# Patient Record
Sex: Male | Born: 1945 | Race: White | Hispanic: No | Marital: Married | Smoking: Former smoker
Health system: Southern US, Community
[De-identification: ages and names within clinical notes are randomized; demographics above are authoritative.]

## PROBLEM LIST (undated history)

## (undated) DIAGNOSIS — E785 Hyperlipidemia, unspecified: Secondary | ICD-10-CM

## (undated) DIAGNOSIS — I219 Acute myocardial infarction, unspecified: Secondary | ICD-10-CM

## (undated) DIAGNOSIS — N529 Male erectile dysfunction, unspecified: Secondary | ICD-10-CM

## (undated) DIAGNOSIS — G4730 Sleep apnea, unspecified: Secondary | ICD-10-CM

## (undated) DIAGNOSIS — M47817 Spondylosis without myelopathy or radiculopathy, lumbosacral region: Secondary | ICD-10-CM

## (undated) DIAGNOSIS — J439 Emphysema, unspecified: Secondary | ICD-10-CM

## (undated) DIAGNOSIS — F329 Major depressive disorder, single episode, unspecified: Secondary | ICD-10-CM

## (undated) DIAGNOSIS — F32A Depression, unspecified: Secondary | ICD-10-CM

## (undated) DIAGNOSIS — I2510 Atherosclerotic heart disease of native coronary artery without angina pectoris: Secondary | ICD-10-CM

## (undated) DIAGNOSIS — E538 Deficiency of other specified B group vitamins: Secondary | ICD-10-CM

## (undated) DIAGNOSIS — I10 Essential (primary) hypertension: Secondary | ICD-10-CM

## (undated) HISTORY — DX: Emphysema, unspecified: J43.9

## (undated) HISTORY — PX: OTHER SURGICAL HISTORY: SHX169

## (undated) HISTORY — DX: Hyperlipidemia, unspecified: E78.5

## (undated) HISTORY — DX: Acute myocardial infarction, unspecified: I21.9

## (undated) HISTORY — PX: CERVICAL LAMINECTOMY: SHX94

## (undated) HISTORY — PX: TONSILLECTOMY: SUR1361

## (undated) HISTORY — DX: Male erectile dysfunction, unspecified: N52.9

## (undated) HISTORY — DX: Spondylosis without myelopathy or radiculopathy, lumbosacral region: M47.817

## (undated) HISTORY — DX: Depression, unspecified: F32.A

## (undated) HISTORY — DX: Major depressive disorder, single episode, unspecified: F32.9

## (undated) HISTORY — PX: KNEE SURGERY: SHX244

## (undated) HISTORY — DX: Deficiency of other specified B group vitamins: E53.8

## (undated) HISTORY — DX: Essential (primary) hypertension: I10

## (undated) HISTORY — DX: Atherosclerotic heart disease of native coronary artery without angina pectoris: I25.10

## (undated) HISTORY — DX: Sleep apnea, unspecified: G47.30

## (undated) HISTORY — PX: VASECTOMY REVERSAL: SHX243

---

## 1997-05-25 ENCOUNTER — Ambulatory Visit (HOSPITAL_COMMUNITY): Admission: RE | Admit: 1997-05-25 | Discharge: 1997-05-25 | Payer: Self-pay | Admitting: *Deleted

## 1997-05-27 ENCOUNTER — Ambulatory Visit (HOSPITAL_COMMUNITY): Admission: RE | Admit: 1997-05-27 | Discharge: 1997-05-28 | Payer: Self-pay | Admitting: *Deleted

## 1998-06-29 ENCOUNTER — Ambulatory Visit: Admission: RE | Admit: 1998-06-29 | Discharge: 1998-06-29 | Payer: Self-pay | Admitting: *Deleted

## 1999-01-02 ENCOUNTER — Encounter: Admission: RE | Admit: 1999-01-02 | Discharge: 1999-01-02 | Payer: Self-pay | Admitting: Family Medicine

## 1999-01-12 ENCOUNTER — Encounter: Payer: Self-pay | Admitting: Sports Medicine

## 1999-01-12 ENCOUNTER — Encounter: Admission: RE | Admit: 1999-01-12 | Discharge: 1999-01-12 | Payer: Self-pay | Admitting: Sports Medicine

## 1999-01-15 HISTORY — PX: ANGIOPLASTY: SHX39

## 1999-01-30 ENCOUNTER — Encounter: Admission: RE | Admit: 1999-01-30 | Discharge: 1999-01-30 | Payer: Self-pay | Admitting: Family Medicine

## 1999-04-05 ENCOUNTER — Ambulatory Visit (HOSPITAL_COMMUNITY): Admission: RE | Admit: 1999-04-05 | Discharge: 1999-04-05 | Payer: Self-pay | Admitting: Family Medicine

## 1999-04-05 ENCOUNTER — Encounter: Admission: RE | Admit: 1999-04-05 | Discharge: 1999-04-05 | Payer: Self-pay | Admitting: Family Medicine

## 1999-10-07 ENCOUNTER — Inpatient Hospital Stay (HOSPITAL_COMMUNITY): Admission: EM | Admit: 1999-10-07 | Discharge: 1999-10-09 | Payer: Self-pay | Admitting: *Deleted

## 1999-10-07 ENCOUNTER — Encounter: Payer: Self-pay | Admitting: Emergency Medicine

## 1999-10-23 ENCOUNTER — Encounter (HOSPITAL_COMMUNITY): Admission: RE | Admit: 1999-10-23 | Discharge: 2000-01-21 | Payer: Self-pay | Admitting: Cardiology

## 2003-12-12 ENCOUNTER — Ambulatory Visit: Payer: Self-pay | Admitting: Family Medicine

## 2004-11-23 ENCOUNTER — Ambulatory Visit: Payer: Self-pay | Admitting: Family Medicine

## 2004-11-30 ENCOUNTER — Ambulatory Visit: Payer: Self-pay | Admitting: Family Medicine

## 2005-01-29 ENCOUNTER — Ambulatory Visit: Payer: Self-pay | Admitting: Gastroenterology

## 2005-04-29 ENCOUNTER — Ambulatory Visit: Payer: Self-pay | Admitting: Gastroenterology

## 2005-04-29 ENCOUNTER — Encounter (INDEPENDENT_AMBULATORY_CARE_PROVIDER_SITE_OTHER): Payer: Self-pay | Admitting: Specialist

## 2005-05-02 ENCOUNTER — Ambulatory Visit: Payer: Self-pay | Admitting: Cardiology

## 2005-06-28 ENCOUNTER — Ambulatory Visit: Payer: Self-pay

## 2005-06-28 ENCOUNTER — Ambulatory Visit: Payer: Self-pay | Admitting: Cardiology

## 2006-04-01 ENCOUNTER — Ambulatory Visit: Payer: Self-pay | Admitting: Family Medicine

## 2006-04-01 LAB — CONVERTED CEMR LAB
ALT: 27 units/L (ref 0–40)
AST: 22 units/L (ref 0–37)
Albumin: 4 g/dL (ref 3.5–5.2)
Alkaline Phosphatase: 55 units/L (ref 39–117)
BUN: 7 mg/dL (ref 6–23)
Basophils Absolute: 0 10*3/uL (ref 0.0–0.1)
Basophils Relative: 0.1 % (ref 0.0–1.0)
Bilirubin, Direct: 0.1 mg/dL (ref 0.0–0.3)
CO2: 27 meq/L (ref 19–32)
Calcium: 8.7 mg/dL (ref 8.4–10.5)
Chloride: 107 meq/L (ref 96–112)
Cholesterol: 151 mg/dL (ref 0–200)
Creatinine, Ser: 1 mg/dL (ref 0.4–1.5)
Eosinophils Absolute: 0.3 10*3/uL (ref 0.0–0.6)
Eosinophils Relative: 3.7 % (ref 0.0–5.0)
GFR calc Af Amer: 98 mL/min
GFR calc non Af Amer: 81 mL/min
Glucose, Bld: 122 mg/dL — ABNORMAL HIGH (ref 70–99)
HCT: 42 % (ref 39.0–52.0)
HDL: 36.1 mg/dL — ABNORMAL LOW (ref >39.0)
Hemoglobin: 14.7 g/dL (ref 13.0–17.0)
Hgb A1c MFr Bld: 5.7 % (ref 4.6–6.0)
LDL Cholesterol: 75 mg/dL (ref 0–99)
Lymphocytes Relative: 19.1 % (ref 12.0–46.0)
MCHC: 35 g/dL (ref 30.0–36.0)
MCV: 96.8 fL (ref 78.0–100.0)
Monocytes Absolute: 0.5 10*3/uL (ref 0.2–0.7)
Monocytes Relative: 7.2 % (ref 3.0–11.0)
Neutro Abs: 5.1 10*3/uL (ref 1.4–7.7)
Neutrophils Relative %: 69.9 % (ref 43.0–77.0)
PSA: 0.93 ng/mL (ref 0.10–4.00)
Platelets: 152 10*3/uL (ref 150–400)
Potassium: 4.5 meq/L (ref 3.5–5.1)
RBC: 4.34 M/uL (ref 4.22–5.81)
RDW: 12.2 % (ref 11.5–14.6)
Sodium: 139 meq/L (ref 135–145)
TSH: 1.53 microintl units/mL (ref 0.35–5.50)
Total Bilirubin: 0.8 mg/dL (ref 0.3–1.2)
Total CHOL/HDL Ratio: 4.2
Total Protein: 6.2 g/dL (ref 6.0–8.3)
Triglycerides: 198 mg/dL — ABNORMAL HIGH (ref 0–149)
VLDL: 40 mg/dL (ref 0–40)
WBC: 7.3 10*3/uL (ref 4.5–10.5)

## 2006-04-15 ENCOUNTER — Ambulatory Visit: Payer: Self-pay | Admitting: Family Medicine

## 2006-04-17 ENCOUNTER — Ambulatory Visit: Payer: Self-pay | Admitting: Cardiology

## 2007-02-16 ENCOUNTER — Telehealth: Payer: Self-pay | Admitting: Family Medicine

## 2007-05-29 ENCOUNTER — Ambulatory Visit: Payer: Self-pay | Admitting: Cardiology

## 2007-06-02 ENCOUNTER — Ambulatory Visit: Payer: Self-pay | Admitting: Family Medicine

## 2007-06-02 LAB — CONVERTED CEMR LAB
Bilirubin Urine: NEGATIVE
Blood in Urine, dipstick: NEGATIVE
Glucose, Urine, Semiquant: NEGATIVE
Ketones, urine, test strip: NEGATIVE
Nitrite: NEGATIVE
Specific Gravity, Urine: 1.025
Urobilinogen, UA: 0.2
WBC Urine, dipstick: NEGATIVE
pH: 6.5

## 2007-06-05 LAB — CONVERTED CEMR LAB
ALT: 25 units/L (ref 0–53)
AST: 23 units/L (ref 0–37)
Albumin: 4.4 g/dL (ref 3.5–5.2)
Alkaline Phosphatase: 63 units/L (ref 39–117)
BUN: 11 mg/dL (ref 6–23)
Basophils Absolute: 0 10*3/uL (ref 0.0–0.1)
Basophils Relative: 0.1 % (ref 0.0–1.0)
Bilirubin, Direct: 0.1 mg/dL (ref 0.0–0.3)
CO2: 29 meq/L (ref 19–32)
Calcium: 9.6 mg/dL (ref 8.4–10.5)
Chloride: 107 meq/L (ref 96–112)
Cholesterol: 141 mg/dL (ref 0–200)
Creatinine, Ser: 1 mg/dL (ref 0.4–1.5)
Direct LDL: 70.3 mg/dL
Eosinophils Absolute: 0.2 10*3/uL (ref 0.0–0.7)
Eosinophils Relative: 2.3 % (ref 0.0–5.0)
GFR calc Af Amer: 98 mL/min
GFR calc non Af Amer: 81 mL/min
Glucose, Bld: 112 mg/dL — ABNORMAL HIGH (ref 70–99)
HCT: 46 % (ref 39.0–52.0)
HDL: 28.9 mg/dL — ABNORMAL LOW (ref >39.0)
Hemoglobin: 15.9 g/dL (ref 13.0–17.0)
Hgb A1c MFr Bld: 5.7 % (ref 4.6–6.0)
Lymphocytes Relative: 17.3 % (ref 12.0–46.0)
MCHC: 34.5 g/dL (ref 30.0–36.0)
MCV: 98.1 fL (ref 78.0–100.0)
Monocytes Absolute: 0.7 10*3/uL (ref 0.1–1.0)
Monocytes Relative: 8.1 % (ref 3.0–12.0)
Neutro Abs: 5.8 10*3/uL (ref 1.4–7.7)
Neutrophils Relative %: 72.2 % (ref 43.0–77.0)
PSA: 1.02 ng/mL (ref 0.10–4.00)
Platelets: 161 10*3/uL (ref 150–400)
Potassium: 4.5 meq/L (ref 3.5–5.1)
RBC: 4.69 M/uL (ref 4.22–5.81)
RDW: 12.5 % (ref 11.5–14.6)
Sodium: 141 meq/L (ref 135–145)
TSH: 1.5 microintl units/mL (ref 0.35–5.50)
Total Bilirubin: 1.1 mg/dL (ref 0.3–1.2)
Total CHOL/HDL Ratio: 4.9
Total Protein: 7.5 g/dL (ref 6.0–8.3)
Triglycerides: 238 mg/dL (ref 0–149)
VLDL: 48 mg/dL — ABNORMAL HIGH (ref 0–40)
WBC: 8.1 10*3/uL (ref 4.5–10.5)

## 2007-06-09 ENCOUNTER — Ambulatory Visit: Payer: Self-pay | Admitting: Family Medicine

## 2007-06-09 DIAGNOSIS — F329 Major depressive disorder, single episode, unspecified: Secondary | ICD-10-CM | POA: Insufficient documentation

## 2007-06-09 DIAGNOSIS — I252 Old myocardial infarction: Secondary | ICD-10-CM | POA: Insufficient documentation

## 2007-06-09 DIAGNOSIS — E785 Hyperlipidemia, unspecified: Secondary | ICD-10-CM | POA: Insufficient documentation

## 2007-06-09 DIAGNOSIS — I10 Essential (primary) hypertension: Secondary | ICD-10-CM | POA: Insufficient documentation

## 2007-07-16 ENCOUNTER — Ambulatory Visit: Payer: Self-pay

## 2007-07-27 ENCOUNTER — Encounter: Payer: Self-pay | Admitting: Family Medicine

## 2007-12-02 ENCOUNTER — Ambulatory Visit: Payer: Self-pay | Admitting: Family Medicine

## 2007-12-04 ENCOUNTER — Ambulatory Visit: Payer: Self-pay | Admitting: Family Medicine

## 2007-12-04 DIAGNOSIS — E538 Deficiency of other specified B group vitamins: Secondary | ICD-10-CM | POA: Insufficient documentation

## 2007-12-04 LAB — CONVERTED CEMR LAB
ALT: 21 units/L (ref 0–53)
AST: 22 units/L (ref 0–37)
Albumin: 4.2 g/dL (ref 3.5–5.2)
Alkaline Phosphatase: 52 units/L (ref 39–117)
BUN: 10 mg/dL (ref 6–23)
Basophils Absolute: 0.1 10*3/uL (ref 0.0–0.1)
Basophils Relative: 0.7 % (ref 0.0–3.0)
Bilirubin, Direct: 0.1 mg/dL (ref 0.0–0.3)
CO2: 27 meq/L (ref 19–32)
Calcium: 9.6 mg/dL (ref 8.4–10.5)
Chloride: 106 meq/L (ref 96–112)
Creatinine, Ser: 0.9 mg/dL (ref 0.4–1.5)
Eosinophils Absolute: 0.4 10*3/uL (ref 0.0–0.7)
Eosinophils Relative: 4.5 % (ref 0.0–5.0)
GFR calc Af Amer: 111 mL/min
GFR calc non Af Amer: 91 mL/min
Glucose, Bld: 74 mg/dL (ref 70–99)
HCT: 43.9 % (ref 39.0–52.0)
Hemoglobin: 15.5 g/dL (ref 13.0–17.0)
Lymphocytes Relative: 25 % (ref 12.0–46.0)
MCHC: 35.3 g/dL (ref 30.0–36.0)
MCV: 100.7 fL — ABNORMAL HIGH (ref 78.0–100.0)
Monocytes Absolute: 0.6 10*3/uL (ref 0.1–1.0)
Monocytes Relative: 7.4 % (ref 3.0–12.0)
Neutro Abs: 4.7 10*3/uL (ref 1.4–7.7)
Neutrophils Relative %: 62.4 % (ref 43.0–77.0)
Platelets: 152 10*3/uL (ref 150–400)
Potassium: 4.7 meq/L (ref 3.5–5.1)
RBC: 4.36 M/uL (ref 4.22–5.81)
RDW: 12.9 % (ref 11.5–14.6)
Sodium: 142 meq/L (ref 135–145)
TSH: 1.64 microintl units/mL (ref 0.35–5.50)
Total Bilirubin: 0.7 mg/dL (ref 0.3–1.2)
Total Protein: 7 g/dL (ref 6.0–8.3)
Vitamin B-12: 218 pg/mL (ref 211–911)
WBC: 7.8 10*3/uL (ref 4.5–10.5)

## 2007-12-11 ENCOUNTER — Ambulatory Visit: Payer: Self-pay | Admitting: Family Medicine

## 2007-12-18 ENCOUNTER — Ambulatory Visit: Payer: Self-pay | Admitting: Family Medicine

## 2007-12-25 ENCOUNTER — Ambulatory Visit: Payer: Self-pay | Admitting: Family Medicine

## 2008-01-05 ENCOUNTER — Ambulatory Visit: Payer: Self-pay | Admitting: Family Medicine

## 2008-01-13 ENCOUNTER — Ambulatory Visit: Payer: Self-pay | Admitting: Family Medicine

## 2008-01-21 ENCOUNTER — Ambulatory Visit: Payer: Self-pay | Admitting: Family Medicine

## 2008-01-29 ENCOUNTER — Ambulatory Visit: Payer: Self-pay | Admitting: Family Medicine

## 2008-02-05 ENCOUNTER — Ambulatory Visit: Payer: Self-pay | Admitting: Family Medicine

## 2008-02-12 ENCOUNTER — Ambulatory Visit: Payer: Self-pay | Admitting: Family Medicine

## 2008-02-18 ENCOUNTER — Ambulatory Visit: Payer: Self-pay | Admitting: Family Medicine

## 2008-02-29 ENCOUNTER — Encounter (INDEPENDENT_AMBULATORY_CARE_PROVIDER_SITE_OTHER): Payer: Self-pay | Admitting: *Deleted

## 2008-03-18 ENCOUNTER — Ambulatory Visit: Payer: Self-pay | Admitting: Family Medicine

## 2008-03-30 ENCOUNTER — Telehealth: Payer: Self-pay | Admitting: Family Medicine

## 2008-04-04 ENCOUNTER — Ambulatory Visit: Payer: Self-pay | Admitting: Gastroenterology

## 2008-04-26 ENCOUNTER — Telehealth: Payer: Self-pay | Admitting: Family Medicine

## 2008-04-26 ENCOUNTER — Ambulatory Visit: Payer: Self-pay | Admitting: Family Medicine

## 2008-05-05 ENCOUNTER — Telehealth: Payer: Self-pay | Admitting: Gastroenterology

## 2008-05-09 ENCOUNTER — Telehealth: Payer: Self-pay | Admitting: Gastroenterology

## 2008-05-09 ENCOUNTER — Telehealth: Payer: Self-pay | Admitting: Family Medicine

## 2008-05-31 ENCOUNTER — Ambulatory Visit: Payer: Self-pay | Admitting: Family Medicine

## 2008-06-22 ENCOUNTER — Telehealth: Payer: Self-pay | Admitting: Gastroenterology

## 2008-06-22 ENCOUNTER — Ambulatory Visit: Payer: Self-pay | Admitting: Gastroenterology

## 2008-06-28 ENCOUNTER — Ambulatory Visit: Payer: Self-pay | Admitting: Family Medicine

## 2008-07-04 DIAGNOSIS — I2510 Atherosclerotic heart disease of native coronary artery without angina pectoris: Secondary | ICD-10-CM | POA: Insufficient documentation

## 2008-07-04 DIAGNOSIS — R079 Chest pain, unspecified: Secondary | ICD-10-CM | POA: Insufficient documentation

## 2008-07-04 DIAGNOSIS — Z9861 Coronary angioplasty status: Secondary | ICD-10-CM

## 2008-07-05 ENCOUNTER — Ambulatory Visit: Payer: Self-pay | Admitting: Cardiology

## 2008-07-05 ENCOUNTER — Telehealth: Payer: Self-pay | Admitting: Gastroenterology

## 2008-07-06 ENCOUNTER — Encounter: Payer: Self-pay | Admitting: Gastroenterology

## 2008-07-06 ENCOUNTER — Ambulatory Visit: Payer: Self-pay | Admitting: Gastroenterology

## 2008-07-08 ENCOUNTER — Telehealth: Payer: Self-pay | Admitting: Cardiology

## 2008-07-12 ENCOUNTER — Encounter: Payer: Self-pay | Admitting: Gastroenterology

## 2008-07-27 ENCOUNTER — Ambulatory Visit: Payer: Self-pay | Admitting: Family Medicine

## 2008-08-24 ENCOUNTER — Ambulatory Visit: Payer: Self-pay | Admitting: Family Medicine

## 2008-09-22 ENCOUNTER — Ambulatory Visit: Payer: Self-pay | Admitting: Family Medicine

## 2008-10-14 ENCOUNTER — Ambulatory Visit: Payer: Self-pay | Admitting: Family Medicine

## 2008-10-25 ENCOUNTER — Ambulatory Visit: Payer: Self-pay | Admitting: Family Medicine

## 2008-10-31 ENCOUNTER — Telehealth: Payer: Self-pay | Admitting: Family Medicine

## 2008-11-03 ENCOUNTER — Telehealth: Payer: Self-pay | Admitting: Pulmonary Disease

## 2008-11-08 ENCOUNTER — Telehealth (INDEPENDENT_AMBULATORY_CARE_PROVIDER_SITE_OTHER): Payer: Self-pay | Admitting: *Deleted

## 2008-11-11 ENCOUNTER — Telehealth: Payer: Self-pay | Admitting: Pulmonary Disease

## 2008-11-23 ENCOUNTER — Ambulatory Visit: Payer: Self-pay | Admitting: Pulmonary Disease

## 2008-11-23 DIAGNOSIS — G4733 Obstructive sleep apnea (adult) (pediatric): Secondary | ICD-10-CM | POA: Insufficient documentation

## 2008-11-24 ENCOUNTER — Telehealth: Payer: Self-pay | Admitting: Family Medicine

## 2008-11-28 ENCOUNTER — Telehealth: Payer: Self-pay | Admitting: Family Medicine

## 2008-11-29 ENCOUNTER — Ambulatory Visit: Payer: Self-pay | Admitting: Family Medicine

## 2008-11-29 LAB — CONVERTED CEMR LAB
Bilirubin Urine: NEGATIVE
Glucose, Urine, Semiquant: NEGATIVE
Ketones, urine, test strip: NEGATIVE
Nitrite: NEGATIVE
Protein, U semiquant: NEGATIVE
Specific Gravity, Urine: 1.02
Urobilinogen, UA: 0.2
WBC Urine, dipstick: NEGATIVE
pH: 7

## 2008-11-30 LAB — CONVERTED CEMR LAB
AST: 21 units/L (ref 0–37)
Alkaline Phosphatase: 61 units/L (ref 39–117)
BUN: 12 mg/dL (ref 6–23)
Basophils Absolute: 0.1 10*3/uL (ref 0.0–0.1)
Bilirubin, Direct: 0.1 mg/dL (ref 0.0–0.3)
Calcium: 9.1 mg/dL (ref 8.4–10.5)
Cholesterol: 147 mg/dL (ref 0–200)
Creatinine, Ser: 1 mg/dL (ref 0.4–1.5)
Direct LDL: 80.7 mg/dL
Eosinophils Absolute: 0.2 10*3/uL (ref 0.0–0.7)
GFR calc non Af Amer: 80.49 mL/min (ref >60)
Glucose, Bld: 133 mg/dL — ABNORMAL HIGH (ref 70–99)
HDL: 35.2 mg/dL — ABNORMAL LOW (ref >39.00)
Hemoglobin: 14 g/dL (ref 13.0–17.0)
Lymphocytes Relative: 19.1 % (ref 12.0–46.0)
MCHC: 34.1 g/dL (ref 30.0–36.0)
Monocytes Relative: 7.7 % (ref 3.0–12.0)
Neutrophils Relative %: 68.7 % (ref 43.0–77.0)
Platelets: 141 10*3/uL — ABNORMAL LOW (ref 150.0–400.0)
RDW: 12.2 % (ref 11.5–14.6)
Sodium: 141 meq/L (ref 135–145)
Total Bilirubin: 0.9 mg/dL (ref 0.3–1.2)
Triglycerides: 248 mg/dL — ABNORMAL HIGH (ref 0.0–149.0)
Vitamin B-12: 464 pg/mL (ref 211–911)

## 2008-12-05 ENCOUNTER — Encounter: Payer: Self-pay | Admitting: Pulmonary Disease

## 2008-12-05 ENCOUNTER — Ambulatory Visit (HOSPITAL_BASED_OUTPATIENT_CLINIC_OR_DEPARTMENT_OTHER): Admission: RE | Admit: 2008-12-05 | Discharge: 2008-12-05 | Payer: Self-pay | Admitting: Pulmonary Disease

## 2008-12-06 ENCOUNTER — Ambulatory Visit: Payer: Self-pay | Admitting: Family Medicine

## 2008-12-06 DIAGNOSIS — R739 Hyperglycemia, unspecified: Secondary | ICD-10-CM | POA: Insufficient documentation

## 2008-12-18 ENCOUNTER — Ambulatory Visit: Payer: Self-pay | Admitting: Pulmonary Disease

## 2008-12-19 ENCOUNTER — Telehealth (INDEPENDENT_AMBULATORY_CARE_PROVIDER_SITE_OTHER): Payer: Self-pay | Admitting: *Deleted

## 2008-12-21 ENCOUNTER — Ambulatory Visit: Payer: Self-pay | Admitting: Pulmonary Disease

## 2009-01-24 ENCOUNTER — Ambulatory Visit: Payer: Self-pay | Admitting: Family Medicine

## 2009-01-24 DIAGNOSIS — M109 Gout, unspecified: Secondary | ICD-10-CM | POA: Insufficient documentation

## 2009-02-13 ENCOUNTER — Ambulatory Visit: Payer: Self-pay | Admitting: Family Medicine

## 2009-02-13 DIAGNOSIS — H6120 Impacted cerumen, unspecified ear: Secondary | ICD-10-CM | POA: Insufficient documentation

## 2009-02-15 ENCOUNTER — Ambulatory Visit: Payer: Self-pay | Admitting: Pulmonary Disease

## 2009-02-21 ENCOUNTER — Ambulatory Visit: Payer: Self-pay | Admitting: Family Medicine

## 2009-03-21 ENCOUNTER — Ambulatory Visit: Payer: Self-pay | Admitting: Family Medicine

## 2009-04-05 ENCOUNTER — Telehealth (INDEPENDENT_AMBULATORY_CARE_PROVIDER_SITE_OTHER): Payer: Self-pay | Admitting: *Deleted

## 2009-04-18 ENCOUNTER — Ambulatory Visit: Payer: Self-pay | Admitting: Family Medicine

## 2009-05-25 ENCOUNTER — Ambulatory Visit: Payer: Self-pay | Admitting: Family Medicine

## 2009-05-30 LAB — CONVERTED CEMR LAB
ALT: 23 units/L (ref 0–53)
AST: 22 units/L (ref 0–37)
Bilirubin, Direct: 0.2 mg/dL (ref 0.0–0.3)
HDL: 34.7 mg/dL — ABNORMAL LOW (ref >39.00)
Hgb A1c MFr Bld: 5.7 % (ref 4.6–6.5)
Total Bilirubin: 0.8 mg/dL (ref 0.3–1.2)
Total CHOL/HDL Ratio: 4
VLDL: 25.2 mg/dL (ref 0.0–40.0)
Vitamin B-12: 505 pg/mL (ref 211–911)

## 2009-06-29 ENCOUNTER — Ambulatory Visit: Payer: Self-pay | Admitting: Family Medicine

## 2009-08-01 ENCOUNTER — Ambulatory Visit: Payer: Self-pay | Admitting: Family Medicine

## 2009-08-29 ENCOUNTER — Ambulatory Visit: Payer: Self-pay | Admitting: Family Medicine

## 2009-09-11 ENCOUNTER — Encounter: Payer: Self-pay | Admitting: Cardiology

## 2009-09-12 ENCOUNTER — Ambulatory Visit: Payer: Self-pay | Admitting: Cardiology

## 2009-09-12 ENCOUNTER — Telehealth: Payer: Self-pay | Admitting: Family Medicine

## 2009-10-03 ENCOUNTER — Ambulatory Visit: Payer: Self-pay | Admitting: Family Medicine

## 2009-10-20 ENCOUNTER — Ambulatory Visit: Payer: Self-pay | Admitting: Family Medicine

## 2009-10-25 ENCOUNTER — Telehealth: Payer: Self-pay | Admitting: Family Medicine

## 2009-10-25 ENCOUNTER — Ambulatory Visit (HOSPITAL_COMMUNITY): Admission: RE | Admit: 2009-10-25 | Discharge: 2009-10-25 | Payer: Self-pay | Admitting: Family Medicine

## 2009-10-31 ENCOUNTER — Ambulatory Visit: Payer: Self-pay | Admitting: Family Medicine

## 2009-11-30 ENCOUNTER — Ambulatory Visit: Payer: Self-pay | Admitting: Family Medicine

## 2009-12-12 ENCOUNTER — Encounter: Payer: Self-pay | Admitting: Pulmonary Disease

## 2009-12-28 ENCOUNTER — Ambulatory Visit: Payer: Self-pay | Admitting: Family Medicine

## 2010-01-24 ENCOUNTER — Ambulatory Visit
Admission: RE | Admit: 2010-01-24 | Discharge: 2010-01-24 | Payer: Self-pay | Source: Home / Self Care | Attending: Family Medicine | Admitting: Family Medicine

## 2010-02-06 IMAGING — CR DG CHEST 2V
2 series · 2 of 2 positions shown · non-contrast
Comparison: Chest x-ray of 11/25/2001

CLINICAL DATA: Cough, smoking history

CHEST - 2 VIEW

[view not recorded (1 of 2)]
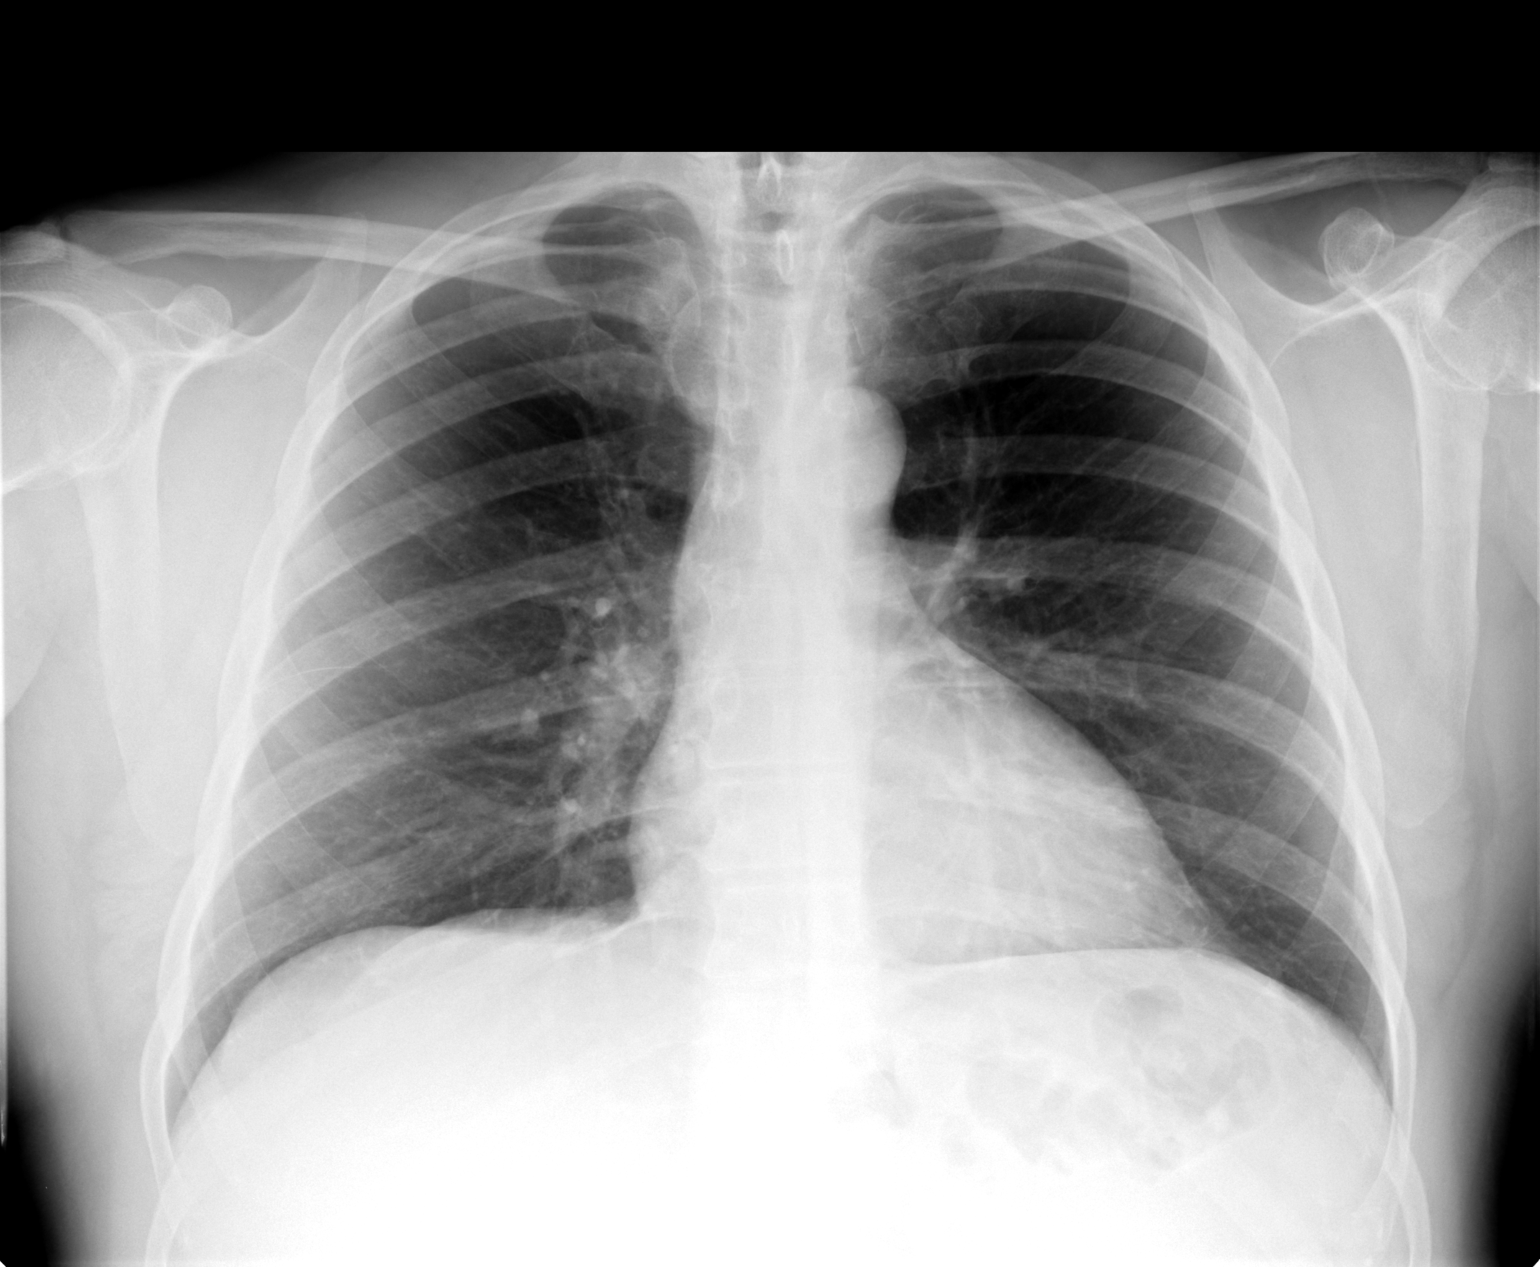

[view not recorded (2 of 2)]
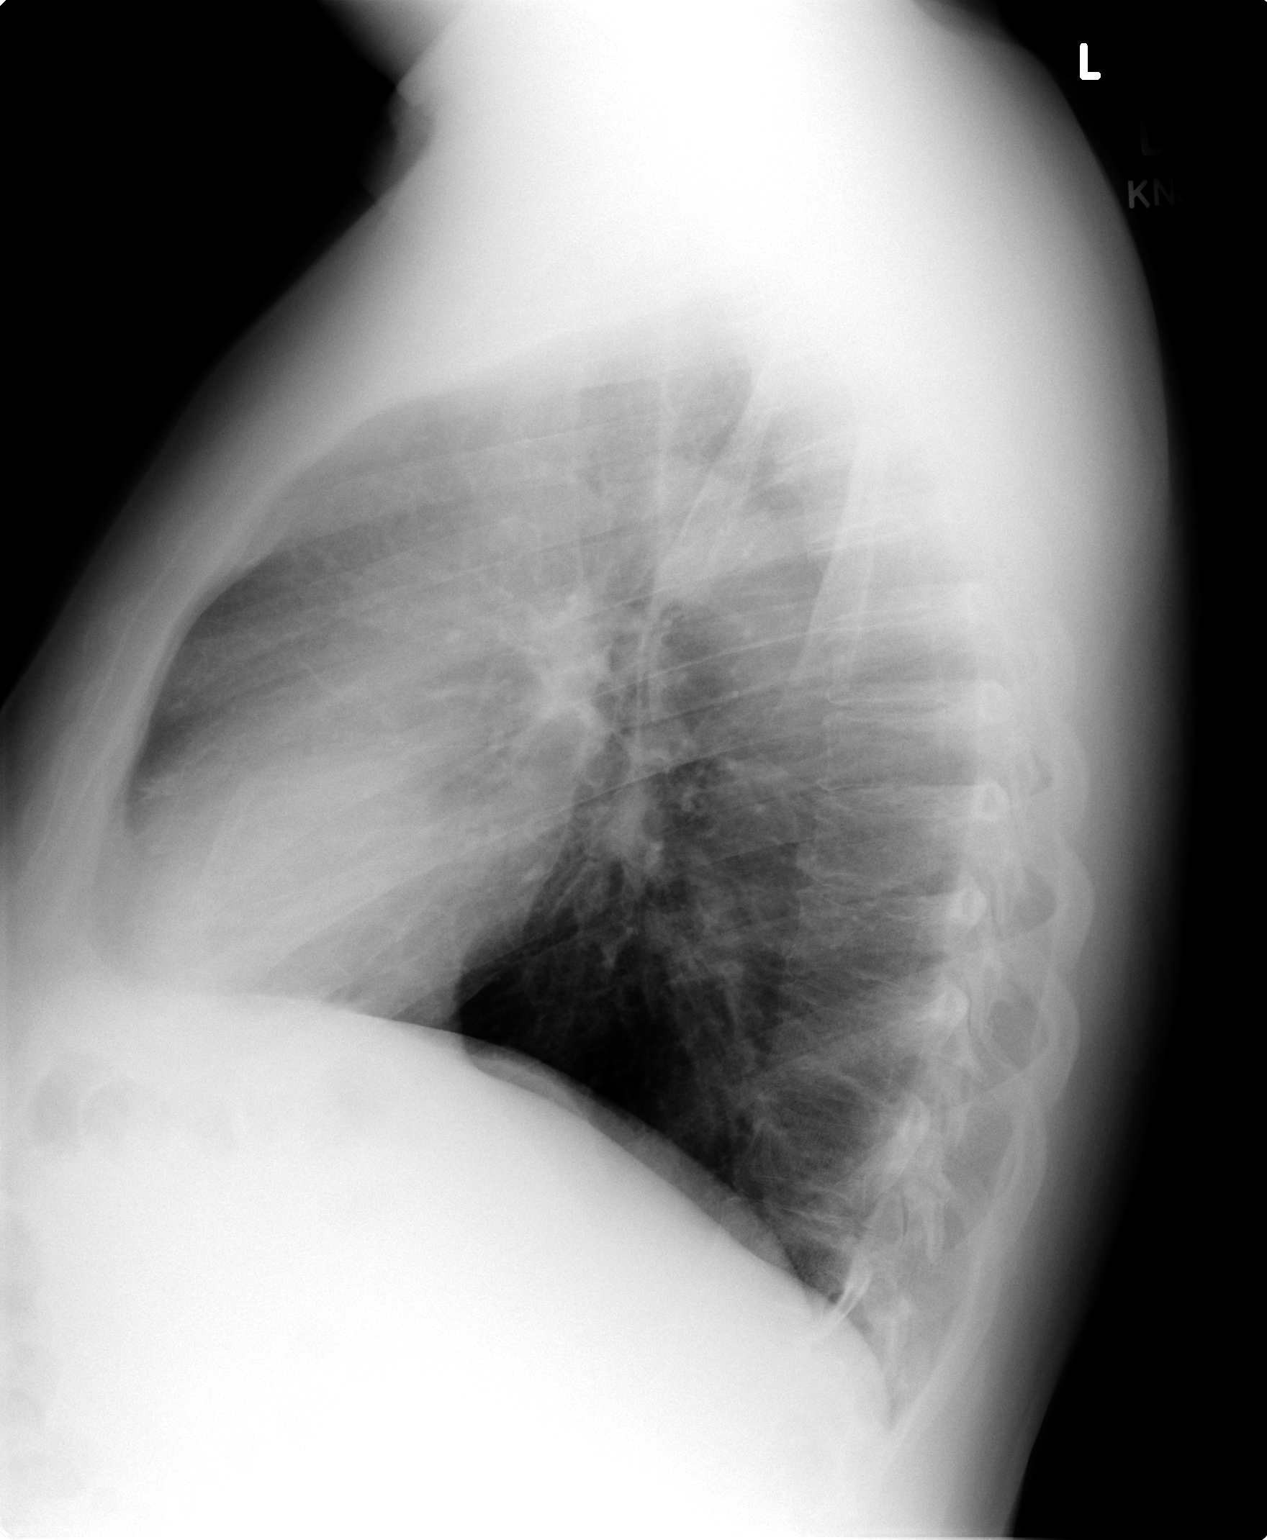

[2 of 2 positions shown; findings below may reference images not displayed]

FINDINGS: The lungs are clear.  The heart is within normal limits
in size.  No bony abnormality is seen.
IMPRESSION: No active lung disease.

## 2010-02-10 ENCOUNTER — Encounter: Payer: Self-pay | Admitting: Family Medicine

## 2010-02-11 LAB — CONVERTED CEMR LAB
ALT: 25 units/L (ref 0–53)
AST: 22 units/L (ref 0–37)
Albumin: 4.4 g/dL (ref 3.5–5.2)
Alkaline Phosphatase: 67 units/L (ref 39–117)
BUN: 13 mg/dL (ref 6–23)
Bilirubin, Direct: 0.2 mg/dL (ref 0.0–0.3)
CO2: 28 meq/L (ref 19–32)
Calcium: 9.3 mg/dL (ref 8.4–10.5)
Chloride: 104 meq/L (ref 96–112)
Cholesterol: 167 mg/dL (ref 0–200)
Creatinine, Ser: 1.1 mg/dL (ref 0.4–1.5)
Creatinine,U: 199.5 mg/dL
Direct LDL: 85.4 mg/dL
GFR calc non Af Amer: 72.2 mL/min (ref >60)
Glucose, Bld: 119 mg/dL — ABNORMAL HIGH (ref 70–99)
HDL: 39.1 mg/dL (ref >39.00)
Hgb A1c MFr Bld: 5.7 % (ref 4.6–6.5)
Microalb Creat Ratio: 9 mg/g (ref 0.0–30.0)
Potassium: 4.7 meq/L (ref 3.5–5.1)
Sodium: 141 meq/L (ref 135–145)
Total Bilirubin: 1.4 mg/dL — ABNORMAL HIGH (ref 0.3–1.2)
Total CHOL/HDL Ratio: 4
Total Protein: 7.5 g/dL (ref 6.0–8.3)
Triglycerides: 326 mg/dL — ABNORMAL HIGH (ref 0.0–149.0)
VLDL: 65.2 mg/dL — ABNORMAL HIGH (ref 0.0–40.0)

## 2010-02-15 NOTE — Assessment & Plan Note (Signed)
Summary: B-12INJ/RCD  rt deltoid   Nurse Visit   Allergies: 1)  Lipitor (Atorvastatin Calcium) 2)  Lipitor (Atorvastatin Calcium)  Medication Administration  Injection # 1:    Medication: Vit B12 1000 mcg    Diagnosis: VITAMIN B12 DEFICIENCY (ICD-266.2)    Route: IM    Site: R deltoid    Exp Date: 06/2011    Lot #: [id number]    Mfr: [employer]    Patient tolerated injection without complications    Given by: Pura Spice, RN (October 03, 2009 8:11 AM)  Orders Added: 1)  Vit B12 1000 mcg [J3420] 2)  Admin of Therapeutic Inj  intramuscular or subcutaneous [[id number]]

## 2010-02-15 NOTE — Assessment & Plan Note (Signed)
Summary: B-12INJ/RCD  left delt   Nurse Visit   Allergies: 1)  Lipitor (Atorvastatin Calcium) 2)  Lipitor (Atorvastatin Calcium)  Medication Administration  Injection # 1:    Medication: Vit B12 1000 mcg    Diagnosis: VITAMIN B12 DEFICIENCY (ICD-266.2)    Route: IM    Site: L deltoid    Exp Date: 07/2011    Lot #: [id number]    Mfr: [employer]    Patient tolerated injection without complications    Given by: Pura Spice, RN (October 31, 2009 8:16 AM)  Orders Added: 1)  Vit B12 1000 mcg [J3420] 2)  Admin of Therapeutic Inj  intramuscular or subcutaneous [16109]
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
## 2010-02-15 NOTE — Assessment & Plan Note (Signed)
Summary: nurse visit  Nurse Visit   Allergies: 1)  Lipitor (Atorvastatin Calcium) 2)  Lipitor (Atorvastatin Calcium)  Medication Administration  Injection # 1:    Medication: Vit B12 1000 mcg    Diagnosis: VITAMIN B12 DEFICIENCY (ICD-266.2)    Route: IM    Site: L deltoid    Exp Date: 09/12    Lot #: [id number]    Mfr: [employer]    Patient tolerated injection without complications    Given by: Raechel Ache, RN (May 25, 2009 8:31 AM)  Orders Added: 1)  Vit B12 1000 mcg [J3420] 2)  Admin of Therapeutic Inj  intramuscular or subcutaneous [[id number]]

## 2010-02-15 NOTE — Assessment & Plan Note (Signed)
Summary: b-12 inj/RCD  Nurse Visit   Allergies: 1)  Lipitor (Atorvastatin Calcium) 2)  Lipitor (Atorvastatin Calcium)  Medication Administration  Injection # 1:    Medication: Vit B12 1000 mcg    Diagnosis: VITAMIN B12 DEFICIENCY (ICD-266.2)    Route: IM    Site: RUOQ gluteus    Exp Date: 9/12    Lot #: [id number]    Mfr: [employer]    Patient tolerated injection without complications    Given by: Alfred Levins, CMA (February 21, 2009 8:08 AM)  Orders Added: 1)  Vit B12 1000 mcg [J3420] 2)  Admin of Therapeutic Inj  intramuscular or subcutaneous [[id number]]

## 2010-02-15 NOTE — Assessment & Plan Note (Signed)
Summary: acute sick visit for osa, cpap issues.   Copy to:  Mitchell [name] Primary Provider/Referring Provider:  Nelwyn Salisbury MD  CC:  Pt is here for a f/u appt.  Pt states he is using his cpap machine "most nights."  Approx 4 hours per night.  Pt c/o having problems with [facility] not communicating on how to use humidifer.  Pt c/o 'difficulty adjusting" to mask.  Mitchell [name]  History of Present Illness: The pt comes in today for f/u of his recently diagnosed osa.  He was started on cpap at the last visit, and he has been having a lot of issues with tolerance.  He initially had issues with the humidity, and suffered from significant nasal dryness.  He now has been instructed on how to use humidifier, and is doing better.  He has had chronic nasal congestion issues in the past, and this sometimes interferes with cpap tolerance despite him having a full face mask.  Finally, he is having the most issue with mask tolerance, wrt having something on his face.  He denies any pressure tolerance issues.  We have received a download from his dme, that shows only a 48% compliance rate greater than 4 hours, and there were 14/54 days not used at all.  Current Medications (verified): 1)  Altace 5 Mg Caps (Ramipril) .Mitchell [name].. 1 By Mouth Two Times A Day 2)  Budeprion Sr 150 Mg Tb12 (Bupropion Hcl) .... Two Times A Day 3)  Bayer Aspirin 325 Mg  Tabs (Aspirin) .... Once Daily 4)  Crestor 20 Mg Tabs (Rosuvastatin Calcium) .Mitchell [name].. 1 Once Daily 5)  Cialis 20 Mg Tabs (Tadalafil) .Mitchell [name].. 1 Tablet Every Other Day As Needed For Erectile Dysfunction 6)  Fish Oil 1200 Mg Caps (Omega-3 Fatty Acids) .Mitchell [name].. 1 Tab Once Daily  Allergies (verified): 1)  Lipitor (Atorvastatin Calcium) 2)  Lipitor (Atorvastatin Calcium)  Review of Systems      See HPI  Vital Signs:  Patient profile:   65 year old male Height:      65.5 inches Weight:      200.13 pounds BMI:     32.92 O2 Sat:      95 % on Room air Temp:     97.9 degrees F oral Pulse rate:   93 /  minute BP sitting:   130 / 66  (left arm) Cuff size:   regular  Vitals Entered By: Arman Filter LPN (February 15, 2009 2:32 PM)  O2 Flow:  Room air CC: Pt is here for a f/u appt.  Pt states he is using his cpap machine "most nights."  Approx 4 hours per night.  Pt c/o having problems with [facility] not communicating on how to use humidifer.  Pt c/o 'difficulty adjusting" to mask.   Comments Medications reviewed with patient Arman Filter LPN  February 15, 2009 2:33 PM    Physical Exam  General:  ow male in nad Nose:  no skin breakdown or pressure necrosis from cpap mask   Impression & Recommendations:  Problem # 1:  OBSTRUCTIVE SLEEP APNEA (ICD-327.23)  the pt is having difficulty with cpap tolerance for multiple reasons.  The humidity issue is easy to solve, and I have gone over how to adjust temperature in order to control moisture.  His main complaint is that of mask tolerance, but it is not related to fit or leaks.  It has to do more with sensation of something being on his face.  The only way to  deal with this is thru desensitization with sedative hypnotic at night SHORT term, and with wearing the device during waking hours for 20-21min at a time periodically.  Will also try to improve nasal airway with nasal ICS and nasal hygeine.  The pt has not had his pressure optimized, but will wait until he is able to wear cpap for most of night without difficulty.  I have also encouraged him to work on weight loss.  He will call me in 2 weeks with his progress.  Time spent with pt today was  Medications Added to Medication List This Visit: 1)  Trazodone Hcl 50 Mg Tabs (Trazodone hcl) .... At bedtime  Other Orders: Est. Patient Level IV (16109)  Patient Instructions: 1)  adjust heater on humidifier to maximize moisture to your airway. 2)  will try trazodone 50mg  one each night at bedtime for next 2 weeks to help with cpap tolerance. 3)  will try nasonex 2 sprays in each nostril every  am to see if nose stays open.  Would also clear nose out with nasal saline spray each night just before going to bed. 4)  give me feedback in 2 weeks with how things are going.  We still have to optimize your pressure for you, but would like to see increased use before doing so. 5)  work on weight loss.   Prescriptions: TRAZODONE HCL 50 MG  TABS (TRAZODONE HCL) At bedtime  #15 x 0   Entered and Authorized by:   Barbaraann Share MD   Signed by:   Barbaraann Share MD on 02/15/2009   Method used:   Print then Give to Patient   RxID:   [id number]    Immunization History:  Influenza Immunization History:    Influenza:  historical (10/14/2008)

## 2010-02-15 NOTE — Assessment & Plan Note (Signed)
Summary: B12 INJ//LH  Nurse Visit   Allergies: 1)  Lipitor (Atorvastatin Calcium) 2)  Lipitor (Atorvastatin Calcium)  Medication Administration  Injection # 1:    Medication: Vit B12 1000 mcg    Diagnosis: VITAMIN B12 DEFICIENCY (ICD-266.2)    Route: IM    Site: RUOQ gluteus    Exp Date: 0647    Lot #: 9/[id number]    Mfr: [employer]    Patient tolerated injection without complications    Given by: Alfred Levins, CMA (March 21, 2009 8:26 AM)  Orders Added: 1)  Vit B12 1000 mcg [J3420] 2)  Admin of Therapeutic Inj  intramuscular or subcutaneous [08657]

## 2010-02-15 NOTE — Assessment & Plan Note (Signed)
Summary: B-12INJ/RCD  Nurse Visit   Allergies: 1)  Lipitor (Atorvastatin Calcium) 2)  Lipitor (Atorvastatin Calcium)  Medication Administration  Injection # 1:    Medication: Vit B12 1000 mcg    Diagnosis: VITAMIN B12 DEFICIENCY (ICD-266.2)    Route: IM    Site: L deltoid    Exp Date: 09/12    Lot #: [id number]    Mfr: [employer]    Patient tolerated injection without complications    Given by: Raechel Ache, RN (June 29, 2009 8:10 AM)  Orders Added: 1)  Vit B12 1000 mcg [J3420] 2)  Admin of Therapeutic Inj  intramuscular or subcutaneous [[id number]]

## 2010-02-15 NOTE — Assessment & Plan Note (Signed)
Summary: b-12 inj  rt deltoid   Nurse Visit   Allergies: 1)  Lipitor (Atorvastatin Calcium) 2)  Lipitor (Atorvastatin Calcium)  Medication Administration  Injection # 1:    Medication: Vit B12 1000 mcg    Diagnosis: VITAMIN B12 DEFICIENCY (ICD-266.2)    Route: IM    Site: R deltoid    Exp Date: 07/2011    Lot #: [id number]    Mfr: [employer]    Patient tolerated injection without complications    Given by: Pura Spice, RN (November 30, 2009 8:16 AM)  Orders Added: 1)  Vit B12 1000 mcg [J3420] 2)  Admin of Therapeutic Inj  intramuscular or subcutaneous [08657]

## 2010-02-15 NOTE — Assessment & Plan Note (Signed)
Summary: gout, b12 shot/cdw   Vital Signs:  Patient profile:   65 year old male Weight:      196 pounds Temp:     97.7 degrees F oral Pulse rate:   90 / minute BP sitting:   116 / 68  (left arm) Cuff size:   regular  Vitals Entered By: Alfred Levins, CMA (January 24, 2009 10:36 AM) CC: discuss diabetes   History of Present Illness: Here for a B12 shot and to discuss gout. Last weekend he had a flare of swelling and pain in the right ankle. He used Ibuprofen, and today it is back to normal with no pain at all. Has not checked his BP for awhile.   Current Medications (verified): 1)  Altace 5 Mg Caps (Ramipril) .Marland Kitchen.. 1 By Mouth Two Times A Day 2)  Budeprion Sr 150 Mg Tb12 (Bupropion Hcl) .... Two Times A Day 3)  Bayer Aspirin 325 Mg  Tabs (Aspirin) .... Once Daily 4)  Zolpidem Tartrate 10 Mg  Tabs (Zolpidem Tartrate) .... As Needed 5)  Crestor 20 Mg Tabs (Rosuvastatin Calcium) .Marland Kitchen.. 1 Once Daily 6)  Cialis 20 Mg Tabs (Tadalafil) .Marland Kitchen.. 1 Tablet Every Other Day As Needed For Erectile Dysfunction 7)  Fish Oil 1200 Mg Caps (Omega-3 Fatty Acids) .Marland Kitchen.. 1 Tab Once Daily  Allergies (verified): 1)  Lipitor (Atorvastatin Calcium) 2)  Lipitor (Atorvastatin Calcium)  Past History:  Past Medical History: sleep apnea, sees Dr. Shelle Iron HYPERLIPIDEMIA (ICD-272.4) HYPERTENSION (ICD-401.9) MYOCARDIAL INFARCTION, HX OF (ICD-412), sees Dr. Jens Som CAD (ICD-414.00) VITAMIN B12 DEFICIENCY (ICD-266.2) DEPRESSION (ICD-311) ED Gout  Review of Systems  The patient denies anorexia, fever, weight loss, weight gain, vision loss, decreased hearing, hoarseness, chest pain, syncope, dyspnea on exertion, peripheral edema, prolonged cough, headaches, hemoptysis, abdominal pain, melena, hematochezia, severe indigestion/heartburn, hematuria, incontinence, genital sores, muscle weakness, suspicious skin lesions, transient blindness, difficulty walking, depression, unusual weight change, abnormal bleeding, enlarged  lymph nodes, angioedema, breast masses, and testicular masses.    Physical Exam  General:  Well-developed,well-nourished,in no acute distress; alert,appropriate and cooperative throughout examination Neck:  No deformities, masses, or tenderness noted. Lungs:  Normal respiratory effort, chest expands symmetrically. Lungs are clear to auscultation, no crackles or wheezes. Heart:  Normal rate and regular rhythm. S1 and S2 normal without gallop, murmur, click, rub or other extra sounds. Extremities:  the right and ankles are normal   Impression & Recommendations:  Problem # 1:  GOUT (ICD-274.9)  Problem # 2:  HYPERTENSION (ICD-401.9)  His updated medication list for this problem includes:    Altace 5 Mg Caps (Ramipril) .Marland Kitchen... 1 by mouth two times a day  Problem # 3:  VITAMIN B12 DEFICIENCY (ICD-266.2)  Orders: Vit B12 1000 mcg (J3420) Admin of Therapeutic Inj  intramuscular or subcutaneous (10272)  Complete Medication List: 1)  Altace 5 Mg Caps (Ramipril) .Marland Kitchen.. 1 by mouth two times a day 2)  Budeprion Sr 150 Mg Tb12 (Bupropion hcl) .... Two times a day 3)  Bayer Aspirin 325 Mg Tabs (Aspirin) .... Once daily 4)  Zolpidem Tartrate 10 Mg Tabs (Zolpidem tartrate) .... As needed 5)  Crestor 20 Mg Tabs (Rosuvastatin calcium) .Marland Kitchen.. 1 once daily 6)  Cialis 20 Mg Tabs (Tadalafil) .Marland Kitchen.. 1 tablet every other day as needed for erectile dysfunction 7)  Fish Oil 1200 Mg Caps (Omega-3 fatty acids) .Marland Kitchen.. 1 tab once daily  Patient Instructions: 1)  check lipids and an A1c in May   Medication Administration  Injection # 1:  Medication: Vit B12 1000 mcg    Diagnosis: VITAMIN B12 DEFICIENCY (ICD-266.2)    Route: IM    Site: L deltoid    Exp Date: 8/12    Lot #: [id number]    Mfr: [employer]    Patient tolerated injection without complications    Given by: Alfred Levins, CMA (January 24, 2009 10:39 AM)  Orders Added: 1)  Vit B12 1000 mcg [J3420] 2)  Admin of Therapeutic Inj  intramuscular or  subcutaneous [96372] 3)  Est. Patient Level IV [93235]

## 2010-02-15 NOTE — Assessment & Plan Note (Signed)
Summary: B-12//ALP  Nurse Visit   Allergies: 1)  Lipitor (Atorvastatin Calcium) 2)  Lipitor (Atorvastatin Calcium)  Medication Administration  Injection # 1:    Medication: Vit B12 1000 mcg    Diagnosis: VITAMIN B12 DEFICIENCY (ICD-266.2)    Route: IM    Site: L deltoid    Exp Date: 02/13    Lot #: [id number]    Mfr: [employer]    Patient tolerated injection without complications    Given by: Raechel Ache, RN (August 01, 2009 8:07 AM)  Orders Added: 1)  Vit B12 1000 mcg [J3420] 2)  Admin of Therapeutic Inj  intramuscular or subcutaneous [16109]

## 2010-02-15 NOTE — Assessment & Plan Note (Signed)
Summary: B-12INJ/RCD  Nurse Visit   Allergies: 1)  Lipitor (Atorvastatin Calcium) 2)  Lipitor (Atorvastatin Calcium)  Medication Administration  Injection # 1:    Medication: Vit B12 1000 mcg    Diagnosis: VITAMIN B12 DEFICIENCY (ICD-266.2)    Route: IM    Site: L deltoid    Exp Date: 09/12    Lot #: [id number]    Mfr: [employer]    Patient tolerated injection without complications    Given by: Raechel Ache, RN (August 29, 2009 8:16 AM)  Orders Added: 1)  Vit B12 1000 mcg [J3420] 2)  Admin of Therapeutic Inj  intramuscular or subcutaneous [40347]

## 2010-02-15 NOTE — Assessment & Plan Note (Signed)
Summary: Mitchell [name]   Referring Provider:  [name] Primary Provider:  Nelwyn Salisbury MD   History of Present Illness: [name] is a gentleman who has a history of coronary artery disease.  In September 2001, he underwent cardiac catheterization in the setting of a myocardial infarction.  He was found to have moderate coronary disease in the left system.  There was 100% right coronary artery.  His ejection fraction was 50%.  He had PTCA/stenting of the right coronary artery at that time. His last Myoview was performed on July 16, 2007. At that time he had a prior mid to basal inferolateral scar but no ischemia. Ejection fraction was 48%. He also had an abdominal ultrasound on July 16, 2007 that showed no aneurysm. I last saw him in June of 2010. Since then he has mild dyspnea on exertion but there is no orthopnea, PND, pedal edema, palpitations or syncope. He's had no chest pain.  Current Medications (verified): 1)  Altace 5 Mg Caps (Ramipril) .Marland Kitchen.. 1 By Mouth Two Times A Day 2)  Budeprion Sr 150 Mg Tb12 (Bupropion Hcl) .... Two Times A Day 3)  Bayer Aspirin 325 Mg  Tabs (Aspirin) .... Once Daily 4)  Crestor 20 Mg Tabs (Rosuvastatin Calcium) .Marland Kitchen.. 1 Once Daily 5)  Cialis 20 Mg Tabs (Tadalafil) .Marland Kitchen.. 1 Tablet Every Other Day As Needed For Erectile Dysfunction 6)  Fish Oil 1200 Mg Caps (Omega-3 Fatty Acids) .Marland Kitchen.. 1 Tab Once Daily  Allergies (verified): 1)  Lipitor (Atorvastatin Calcium) 2)  Lipitor (Atorvastatin Calcium)  Past History:  Past Medical History: Reviewed history from 01/24/2009 and no changes required. sleep apnea, sees Dr. Shelle Iron HYPERLIPIDEMIA (ICD-272.4) HYPERTENSION (ICD-401.9) MYOCARDIAL INFARCTION, HX OF (ICD-412), sees Dr. Jens Som CAD (ICD-414.00) VITAMIN B12 DEFICIENCY (ICD-266.2) DEPRESSION (ICD-311) ED Gout  Past Surgical History: Reviewed history from 12/06/2008 and no changes required. Angioplasty and stent placement Tonsillectomy Vasectomy, then reversal knee  surgery strabismus repair Cervical laminectomy colonoscopy 07-06-08 per Dr. Jarold Motto, repeat in 3 yrs  Social History: Reviewed history from 11/23/2008 and no changes required. Married.  pt has children. Pt is a former smoker.  smoked x 49 years.  1 ppd.  quit 2009.  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 Remaining systems are negative.   Vital Signs:  Patient profile:   65 year old male Weight:      201 pounds Pulse rate:   76 / minute Pulse rhythm:   regular BP sitting:   116 / 70  (left arm) Cuff size:   regular  Vitals Entered By: Deliah Goody, RN (September 12, 2009 8:06 AM)  Physical Exam  General:  Well-developed well-nourished in no acute distress.  Skin is warm and dry.  HEENT is normal.  Neck is supple. No thyromegaly.  Chest is clear to auscultation with normal expansion.  Cardiovascular exam is regular rate and rhythm.  Abdominal exam nontender or distended. No masses palpated. Extremities show no edema. neuro grossly intact    EKG  Procedure date:  09/12/2009  Findings:      catheter normal sinus rhythm at a rate of 76. Axis normal. Prior inferior infarct.  Impression & Recommendations:  Problem # 1:  DIABETES MELLITUS, TYPE II (ICD-250.00) Management per primary care. His updated medication list for  this problem includes:    Altace 5 Mg Caps (Ramipril) .Marland Kitchen... 1 by mouth two times a day    Bayer Aspirin 325 Mg Tabs (Aspirin) ..... Once daily  Problem # 2:  OBSTRUCTIVE SLEEP APNEA (ICD-327.23) Management per pulmonary.  Problem # 3:  HYPERLIPIDEMIA (ICD-272.4) Continue statin. Lipids and liver monitored by primary care. His updated medication list for this problem includes:    Crestor 20  Mg Tabs (Rosuvastatin calcium) .Marland Kitchen... 1 once daily  Problem # 4:  HYPERTENSION (ICD-401.9) Blood pressure controlled on present medications. Will continue. His updated medication list for this problem includes:    Altace 5 Mg Caps (Ramipril) .Marland Kitchen... 1 by mouth two times a day    Bayer Aspirin 325 Mg Tabs (Aspirin) ..... Once daily  Problem # 5:  CAD (ICD-414.00) Continue aspirin, ACE inhibitor and statin. Plan Myoview in one year when he returns. His updated medication list for this problem includes:    Altace 5 Mg Caps (Ramipril) .Marland Kitchen... 1 by mouth two times a day    Bayer Aspirin 325 Mg Tabs (Aspirin) ..... Once daily  Patient Instructions: 1)  Your physician recommends that you schedule a follow-up appointment in: ONE YEAR

## 2010-02-15 NOTE — Assessment & Plan Note (Signed)
Summary: B-12INJ/RCD/PT RSC/CJR  Nurse Visit   Allergies: 1)  Lipitor (Atorvastatin Calcium) 2)  Lipitor (Atorvastatin Calcium)  Medication Administration  Injection # 1:    Medication: Vit B12 1000 mcg    Diagnosis: VITAMIN B12 DEFICIENCY (ICD-266.2)    Route: IM    Site: L deltoid    Exp Date: 08/15/2011    Lot #: [id number]    Mfr: [employer]    Patient tolerated injection without complications    Given by: Romualdo Bolk, CMA (AAMA) (January 24, 2010 8:28 AM)  Orders Added: 1)  Vit B12 1000 mcg [J3420] 2)  Admin of Therapeutic Inj  intramuscular or subcutaneous [11914]

## 2010-02-15 NOTE — Progress Notes (Signed)
Summary: gout  Phone Note Call from Patient   Caller: Patient Call For: Nelwyn Salisbury MD Summary of Call: Pt is having a flare of gout, is asking for RX.  Pain is in joint of big toe. Karin Golden [phone] Initial call taken by: Lynann Beaver CMA,  September 12, 2009 8:24 AM  Follow-up for Phone Call        call in Indocin 50 mg three times a day as needed gout , #60 with 3 rf  Follow-up by: Nelwyn Salisbury MD,  September 12, 2009 1:22 PM    New/Updated Medications: INDOMETHACIN 50 MG CAPS (INDOMETHACIN) 1 three times a day as needed gout Prescriptions: INDOMETHACIN 50 MG CAPS (INDOMETHACIN) 1 three times a day as needed gout  #60 x 3   Entered by:   Raechel Ache, RN   Authorized by:   Nelwyn Salisbury MD   Signed by:   Raechel Ache, RN on 09/12/2009   Method used:   Electronically to        [facility] (retail)       [address]       [place], [place]  [address]       Ph: [phone]       Fax: [phone]   RxID:   [id number]

## 2010-02-15 NOTE — Progress Notes (Signed)
Summary: poor tolerance of cpap  Phone Note Other Incoming   Summary of Call: received correspondence from advanced the pt not tolerating cpap due to dryness.  they wish to try chin strap, but pt is requesting a decrease in pressure. Please let pt know that we can decrease pressure a little bit, but any further it won't help him.  I also don't favor using a chin strap with a full face mask.  Will send order to decrease pressure to 8cm, but needs ov with me in 4 weeks for f/u  Initial call taken by: Barbaraann Share MD,  April 05, 2009 6:08 PM  Follow-up for Phone Call        see first page. Follow-up by: Barbaraann Share MD,  April 05, 2009 6:09 PM  Additional Follow-up for Phone Call Additional follow up Details #1::        [email or web address] Bickers CMA  April 06, 2009 9:37 AM  [phone] - pt returned a call to the triage nurse.  Please call him at # provided. Additional Follow-up by: Eugene Gavia,  April 07, 2009 8:09 AM    Additional Follow-up for Phone Call Additional follow up Details #2::    called and spoke with pt.  pt aware not to use chin strap with full face mask.  pt also aware [facility] sent order to [facility] to decrease cpap pressure.  pt scheduled 4 week f/u appt for 05/09/2009 at 9am.  Arman Filter LPN  April 07, 2009 8:53 AM

## 2010-02-15 NOTE — Assessment & Plan Note (Signed)
Summary: TDAP INJ // RS  Nurse Visit   Allergies: 1)  Lipitor (Atorvastatin Calcium) 2)  Lipitor (Atorvastatin Calcium)  Immunizations Administered:  Tetanus Vaccine:    Vaccine Type: Tdap    Site: left deltoid    Mfr: [employer]    Dose: 0.5 ml    Route: IM    Given by: Pura Spice, RN    Exp. Date: 11/03/2011    Lot #: [id number]    VIS given: 12/02/07 version given October 20, 2009.  Orders Added: 1)  Tdap => 78yrs IM [90715] 2)  Admin 1st Vaccine [10272]

## 2010-02-15 NOTE — Assessment & Plan Note (Signed)
Summary: B-12INJ/RCD  Nurse Visit   Allergies: 1)  Lipitor (Atorvastatin Calcium) 2)  Lipitor (Atorvastatin Calcium)  Medication Administration  Injection # 1:    Medication: Vit B12 1000 mcg    Diagnosis: VITAMIN B12 DEFICIENCY (ICD-266.2)    Route: IM    Site: RUOQ gluteus    Exp Date: 09/2010    Lot #: [id number]    Mfr: American Regent    Patient tolerated injection without complications    Given by: Duard Brady LPN (April 19, 9602 8:10 AM)  Orders Added: 1)  Vit B12 1000 mcg [J3420] 2)  Admin of Therapeutic Inj  intramuscular or subcutaneous [54098]

## 2010-02-15 NOTE — Assessment & Plan Note (Signed)
Summary: b12 inj//ccm----PT [facility] // RS  Nurse Visit   Allergies: 1)  Lipitor (Atorvastatin Calcium) 2)  Lipitor (Atorvastatin Calcium)  Medication Administration  Injection # 1:    Medication: Vit B12 1000 mcg    Diagnosis: VITAMIN B12 DEFICIENCY (ICD-266.2)    Route: IM    Site: R deltoid    Exp Date: 07/15/2011    Lot #: [id number]    Mfr: American Regent    Patient tolerated injection without complications    Given by: Romualdo Bolk, CMA (AAMA) (December 29, 2009 8:01 AM)

## 2010-02-15 NOTE — Assessment & Plan Note (Signed)
Summary: ears clogged/cdw   Vital Signs:  Patient profile:   65 year old male Weight:      196 pounds  Vitals Entered By: Alfred Levins, CMA (February 13, 2009 11:07 AM) CC: pt just needed his ears cleaned out, no pain   History of Present Illness: Here to have his ears cleaned out. He has had decreased hearing bilaterally. No discomfort.  Allergies: 1)  Lipitor (Atorvastatin Calcium) 2)  Lipitor (Atorvastatin Calcium)  Past History:  Past Medical History: Reviewed history from 01/24/2009 and no changes required. sleep apnea, sees Dr. Shelle Iron HYPERLIPIDEMIA (ICD-272.4) HYPERTENSION (ICD-401.9) MYOCARDIAL INFARCTION, HX OF (ICD-412), sees Dr. Jens Som CAD (ICD-414.00) VITAMIN B12 DEFICIENCY (ICD-266.2) DEPRESSION (ICD-311) ED Gout  Review of Systems  The patient denies anorexia, fever, weight loss, weight gain, vision loss, hoarseness, chest pain, syncope, dyspnea on exertion, peripheral edema, prolonged cough, headaches, hemoptysis, abdominal pain, melena, hematochezia, severe indigestion/heartburn, hematuria, incontinence, genital sores, muscle weakness, suspicious skin lesions, transient blindness, difficulty walking, depression, unusual weight change, abnormal bleeding, enlarged lymph nodes, angioedema, breast masses, and testicular masses.    Physical Exam  General:  Well-developed,well-nourished,in no acute distress; alert,appropriate and cooperative throughout examination Ears:  both canals filled with cerumen.    Impression & Recommendations:  Problem # 1:  CERUMEN IMPACTION, BILATERAL (ICD-380.4)  Orders: Cerumen Impaction Removal (98119)  Complete Medication List: 1)  Altace 5 Mg Caps (Ramipril) .Marland Kitchen.. 1 by mouth two times a day 2)  Budeprion Sr 150 Mg Tb12 (Bupropion hcl) .... Two times a day 3)  Bayer Aspirin 325 Mg Tabs (Aspirin) .... Once daily 4)  Zolpidem Tartrate 10 Mg Tabs (Zolpidem tartrate) .... As needed 5)  Crestor 20 Mg Tabs (Rosuvastatin  calcium) .Marland Kitchen.. 1 once daily 6)  Cialis 20 Mg Tabs (Tadalafil) .Marland Kitchen.. 1 tablet every other day as needed for erectile dysfunction 7)  Fish Oil 1200 Mg Caps (Omega-3 fatty acids) .Marland Kitchen.. 1 tab once daily  Patient Instructions: 1)  both canals  irrigated clear with water. 2)  Please schedule a follow-up appointment as needed .

## 2010-02-15 NOTE — Letter (Signed)
Summary: Statement of Medical Necessity / [facility]  Statement of Medical Necessity / [facility]   Imported By: Lennie Odor 12/14/2009 12:05:33  _____________________________________________________________________  External Attachment:    Type:   Image     Comment:   External Document

## 2010-02-27 ENCOUNTER — Ambulatory Visit: Payer: Self-pay | Admitting: Family Medicine

## 2010-03-01 ENCOUNTER — Ambulatory Visit (INDEPENDENT_AMBULATORY_CARE_PROVIDER_SITE_OTHER): Payer: BC Managed Care – PPO | Admitting: Family Medicine

## 2010-03-01 DIAGNOSIS — D518 Other vitamin B12 deficiency anemias: Secondary | ICD-10-CM

## 2010-03-01 DIAGNOSIS — D519 Vitamin B12 deficiency anemia, unspecified: Secondary | ICD-10-CM

## 2010-03-01 MED ORDER — CYANOCOBALAMIN 1000 MCG/ML IJ SOLN
1000.0000 ug | Freq: Once | INTRAMUSCULAR | Status: AC
  Administered 2010-03-01: 1000 ug via INTRAMUSCULAR

## 2010-03-01 NOTE — Progress Notes (Signed)
  Subjective:    Patient ID: Mitchell [name], male    DOB: 01-12-1946, 65 y.o.   MRN: [id number]  HPI    Review of Systems     Objective:   Physical Exam        Assessment & Plan:

## 2010-04-05 ENCOUNTER — Ambulatory Visit (INDEPENDENT_AMBULATORY_CARE_PROVIDER_SITE_OTHER): Payer: BC Managed Care – PPO | Admitting: Family Medicine

## 2010-04-05 DIAGNOSIS — E538 Deficiency of other specified B group vitamins: Secondary | ICD-10-CM

## 2010-04-05 MED ORDER — CYANOCOBALAMIN 1000 MCG/ML IJ SOLN
1000.0000 ug | Freq: Once | INTRAMUSCULAR | Status: AC
  Administered 2010-04-05: 1000 ug via INTRAMUSCULAR

## 2010-05-03 ENCOUNTER — Ambulatory Visit (INDEPENDENT_AMBULATORY_CARE_PROVIDER_SITE_OTHER): Payer: BC Managed Care – PPO | Admitting: Family Medicine

## 2010-05-03 DIAGNOSIS — D519 Vitamin B12 deficiency anemia, unspecified: Secondary | ICD-10-CM

## 2010-05-03 DIAGNOSIS — D518 Other vitamin B12 deficiency anemias: Secondary | ICD-10-CM

## 2010-05-03 MED ORDER — CYANOCOBALAMIN 1000 MCG/ML IJ SOLN
1000.0000 ug | Freq: Once | INTRAMUSCULAR | Status: AC
  Administered 2010-05-03: 1000 ug via INTRAMUSCULAR

## 2010-05-15 ENCOUNTER — Telehealth: Payer: Self-pay | Admitting: *Deleted

## 2010-05-15 NOTE — Telephone Encounter (Signed)
Pt talked to [facility].  Crestor is expensive so he wants a different drug that does have a generic.  If so, he needs it sent to [facility].

## 2010-05-15 NOTE — Telephone Encounter (Signed)
LMTCB

## 2010-05-16 NOTE — Telephone Encounter (Signed)
No, we cannot change this. There is no other cholesterol drug that is close to Crestor as far as effectiveness except Lipitor, and he had a reaction to that.

## 2010-05-16 NOTE — Telephone Encounter (Signed)
Left message on pt's personal voicemail 

## 2010-05-29 NOTE — Assessment & Plan Note (Signed)
[facility]                            CARDIOLOGY OFFICE NOTE   FABRIZZIO, MARCELLA                          MRN:          [id number]  DATE:05/29/2007                            DOB:          1945/10/18    HISTORY OF PRESENT ILLNESS:  Mr. Mcauliffe is a gentleman who has a history  of coronary artery disease.  In September 2001, he underwent cardiac  catheterization in the setting of a myocardial infarction.  He was found  to have moderate coronary disease in the left system.  There was 100%  right coronary artery.  His ejection fraction was 50%.  He had  PTCA/stenting of the right coronary artery at that time.  Since I last  saw him, he is doing reasonable well.  There is mild dyspnea with more  extreme activities.  There is no orthopnea, PND or pedal edema.  He  occasionally feels some pain in his chest when he feels stressed, but  it is brief.  He does not have exertional chest pain.  The pain is not  pleuritic or positional, nor is it related to food.  It resolved  spontaneously.  He does continue to smoke.  He is not exercising or  following a diet.   MEDICATIONS:  1. Altace 5 mg p.o. b.i.d.  2. Wellbutrin 150 mg p.o. b.i.d.  3. Aspirin 325 mg daily.  4. Vytorin 10/40 daily.   PHYSICAL EXAMINATION:  VITAL SIGNS:  Blood pressure 146/85, pulse is 82,  weighs 190 pounds.  HEENT:  Normal.  NECK:  Supple with no bruits noted.  CHEST:  Clear.  CARDIOVASCULAR:  Regular rate and rhythm.  ABDOMEN:  Shows no tenderness.  There is a soft mid abdominal bruit.  EXTREMITIES:  Show no edema.   STUDIES:  Electrocardiogram shows a sinus rhythm at a rate of 83 with a  prior inferior infarct.   DIAGNOSES:  1. Coronary artery disease.  Mr. Dibiasio will continue with his aspirin,      statin and ACE inhibitor.  2. Vague chest pain - we will plan to proceed with a Myoview for risk      stratification.  3. Hyperlipidemia.  He will continue on his Vytorin, and we will  have      his most recent lipids and liver forwarded to [name] for our records.      He has not tolerated Niaspan or Lipitor in the past.  4. History of beta blocker intolerance secondary to impotence.  5. Tobacco abuse.  We discussed the importance of discontinuing this      for 3-10 minutes.  6. Hypertension.  His blood pressure is mildly elevated, but he has      not taken his Altace today.  We will resume this.   PLAN:  He will continue with diet and exercise.  I have also schedule  him to have an ultrasound of his abdomen to exclude aneurysm given soft  bruit.     Madolyn Frieze Jens Som, MD, [facility]  Electronically Signed    BSC/MedQ  DD: 05/29/2007  DT: 05/29/2007  Job #: [id number]

## 2010-06-01 NOTE — Assessment & Plan Note (Signed)
[facility]                            CARDIOLOGY OFFICE NOTE   Mitchell [name], Mitchell [name]                          MRN:          [id number]  DATE:04/17/2006                            DOB:          Sep 28, 1945    Mitchell [name] is a gentleman who has a history of coronary disease status  post PCI of his right coronary artery in the setting of a myocardial  infarction.  His most recent Myoview was performed on 06/28/2005.  He had  a prior inferior infarct but no ischemia.  His ejection fraction was  51%.  Since I last saw him, he does have mild dyspnea on exertion which  has been a chronic issue.  There is no orthopnea, PND, pedal edema,  palpitations, presyncope, syncope, chest pain, or claudication.  Note,  his myocardial infarction occurred in September of 2001.   MEDICATIONS:  1. Altace 5 mg p.o. b.i.d.  2. Wellbutrin 150 mg p.o. b.i.d.  3. Aspirin 325 mg p.o. daily  4. Vytorin 10/40 daily.   PHYSICAL EXAMINATION:  Blood pressure 150/83 and his pulse is 98.  He  weighs 189 pounds.  NECK:  Supple, no bruits.  CHEST:  Clear.  CARDIOVASCULAR:  Regular rate and rhythm.  ABDOMEN:  No pulsatile masses, no bruits.  EXTREMITIES:  No edema.  He has 2+ posterior tibial pulses bilaterally.   EKG shows sinus rhythm at a rate of 98.  There is a prior inferior  infarct but there are no other ST changes noted.   DIAGNOSES:  1. Coronary artery disease:  The patient is doing well from a      symptomatic  standpoint.  His most recent Myoview showed infarcts,      no ischemia.  We will continue with medical therapy including his      aspirin and Vytorin.  His most recent lipids showed an LDL of      approximately 75.  2. Hyperlipidemia:  We will continue with his present dose of Vytorin.      Note, his HDL has been low but he has not tolerated Niaspan in the      past.  3. History of Lipitor intolerance secondary to rash.  4. History of beta blocker intolerance secondary  to impotence.  5. Tobacco abuse:  The patient is counseled extensively again on the      importance of discontinuance of his tobacco use.  6. Hypertension:  We will add hydrochlorothiazide 12.5 mg p.o. daily      for better blood pressure control.  We will check a BMET in one      week, follow his potassium and renal function.  7. Risk factor modification:  We again discussed the importance of      diet and exercise.  I will see him back in one year.     Madolyn Frieze Mitchell Som, MD, [facility]  Electronically Signed    BSC/MedQ  DD: 04/17/2006  DT: 04/17/2006  Job #: [id number]

## 2010-06-01 NOTE — Discharge Summary (Signed)
[place]. [facility]  Patient:    Mitchell [name], Mitchell [name]                          MRN: [id number] Adm. Date:  [id number] Disc. Date: 10/09/99 Attending:  Veneda Melter Dictator:   Lavella Hammock, P.A.                  Referring Physician Discharge [place]  DATE OF BIRTH:  [birth year]  PROCEDURES: 1. Cardiac catheterization. 2. Coronary arteriogram. 3. Left ventriculogram.  HOSPITAL COURSE:  Mr. Heikkila is a 65 year old male with past medical history of peptic ulcer disease and back surgery who complained of burning chest pain on the Friday before admission for one hour, and then it happened again on Saturday for 30 minutes.  He woke on Sunday with substernal chest pain associated with nausea and diaphoresis.  He came to the emergency room and his EKG showed acute inferior ST changes.  He had gone to [facility], but he was transferred to further evaluation and urgent catheterization.  He was placed on aspirin, Plavix, heparin, and ReoPro.  He had a cardiac catheterization which showed mild diffuse in the left main, an LAD with a 50% proximal lesion after D-1 and a 50% mid lesion after D-2. There were three diagonals.  The circumflex had a bifurcating OM and had a 30% stenosis.  The RCA was dominant and had a PDA and three other branching vessels.  It was totalled in the mid portion with a long mid 50% distal lesion as well.  He had PTCA and stent to the mid RCA, reducing the stenosis from 100 to 0 with TIMI 3 flow.  His left ventriculogram showed severe inferior hypokinesis with an EF of 50%.  He tolerated the procedure well and the sheath was removed without difficulty.  He was on nitroglycerin and this was weaned over the course of 24 hours, and he had no further episodes of chest pain.  He had aspirin and Plavix.  He had Lopressor added at low dose because of some bradycardia that had been seen during the acute event.  He was scheduled for cardiac rehab.  By  October 08, 1999, he was transferred to the floor and seen by cardiac rehab, where he ambulated without difficulty for greater than 500 feet with no significant decrease in his oxygen saturation and no chest pain.  By October 09, 1999 his Lopressor had been increased to 25 mg p.o. b.i.d. and he had his lipids evaluated, which showed a low HDL and slightly high triglycerides, so he was started on Niaspan.  He was ambulating without difficulty and his vital signs were stable, and he was considered stable for discharge on October 09, 1999 p.m.  LABORATORY VALUES:  Hemoglobin 12.2, hematocrit 34.9, wbcs 10.2, platelets 163.  Sodium 140, potassium 3.8, chloride 105, CO2 30, BUN 9, creatinine 0.9, glucose 116, calcium 8.6.  Total cholesterol 162, triglycerides 318, HDL 24, LDL 74.  Chest x-ray:  Perihilar markings are slightly accentuated with slight peribronchial thickening, but there is no active process.  Adjacent to the lower right hilum, there is about a 7 mm nodular shadow that appears dense and I believe is a calcified granuloma.  The bones are unremarkable.  DISCHARGE CONDITION:  Improved.  CONSULTS:  None.  COMPLICATIONS:  None.  DISCHARGE DIAGNOSES: 1. Acute inferior myocardial infarction. 2. Dislipidemia with a low HDL and elevated triglycerides. 3. Ongoing tobacco use. 4.  Family history of coronary artery disease. 5. Status post back surgery and tonsillectomy. 6. History of peptic ulcer disease.  DISCHARGE INSTRUCTIONS:  ACTIVITY:  He is to do no driving for one week and no sexual or strenuous activity until cleared by M.D.  DIET:  He is to stick to a low fat diet.  WOUND CARE:  He is to call the office for bleeding, swelling, or drainage at the catheterization site.  FOLLOW-UP:  He is to follow up with Dr. Jens Som at an appointment set up prior to discharge.  He is to obtain a family doctor and follow up with them also.  DISCHARGE MEDICATIONS: 1.  Lopressor 50 mg one-half tablet b.i.d. 2. Niaspan 5 mg q.d. 3. Nitroglycerin 0.4 mg sublingual p.r.n. 4. Plavix 75 mg q.d. for one month. 5. Coated aspirin 325 mg q.d. DD:  10/09/99 TD:  10/09/99 Job: [id number]

## 2010-06-01 NOTE — Cardiovascular Report (Signed)
[place]. [facility]  Patient:    Mitchell [name], Mitchell [name]                          MRN: [id number] Proc. Date: 10/07/99 Adm. Date:  [id number] Attending:  Veneda Melter CC:         Madolyn Frieze. Jens Som, M.D. [facility]                        Cardiac Catheterization  PROCEDURES PERFORMED: 1. Left heart catheterization. 2. Left ventriculogram. 3. Selective coronary angiography. 4. Percutaneous transluminal coronary angioplasty and stenting of the mid    right coronary artery.  DIAGNOSES: 1. Severe single-vessel coronary artery disease. 2. Acute inferior wall myocardial infarction. 3. Low normal left ventricular systolic function.  HISTORY:  Mr. Waide is a 65 year old white male with multiple cardiac risk factors who presents with acute onset of substernal chest discomfort.  The patient presented to [facility] Emergency Room where he was found to have ST elevation in the inferior leads consistent with an acute inferior wall myocardial infarction.  He was transferred to [facility] for emergent heart catheterization.  DESCRIPTION OF PROCEDURE:  After informed consent was obtained, the patient was brought to the catheterization lab where both groins were sterilely prepped and draped.  Lidocaine, 1%, was used to infiltrate the right groin.  A 7-French arterial and 6-French venous sheath were placed using modified Seldinger technique.  The 6-French JL4 and JR4 catheters were then used to engage the left and right coronary arteries, and selective angiography was performed in various projections using manual injections of contrast.  A 6-French pigtail catheter was then advanced to the left ventricle, and a left ventriculogram was performed using power injections of contrast.  INITIAL FINDINGS:  Initial findings are as follows: 1. Left main trunk:  Large caliber vessel with mild irregularities. 2. LAD:  This is a medium caliber vessel that provides three diagonal    branches.   The LAD has diffuse disease with narrowings of 50% at the    takeoff of the first and second diagonal branches.  The distal LAD has    mild irregularities.  The first and second diagonal branches are medium    caliber vessels with mild diffuse disease with narrowings of not greater    than 30%.  The third diagonal branch is a medium caliber vessel that    bifurcates in its mid section.  The medial division has an ostial    narrowing of 50%. 3. Left circumflex artery:  This is a medium caliber vessel that provides    a small bifurcating first marginal branch in the mid section.  There is    diffuse disease of 30% in the left circumflex system. 4. Right coronary artery:  Is dominant.  This is a large caliber vessel that    provides a posterior descending artery and three posterior ventricular    branches in its terminal segment.  The right coronary artery has a long    severe narrowing of 100% in the mid section.  There is diffuse disease    distally with narrowings of 30-50% prior to the takeoff of the posterior    descending artery. 5. LV:  Normal end systolic and end diastolic dimensions.  Overall left    ventricular function low normal.  Ejection fraction approximately 50%.    There is no mitral regurgitation.  Akinesis of the basal inferior wall is  noted with severe hypokinesis of the mid inferior wall.  LV pressure is    113/10.  Aortic was 113/78.  LVEDP equals 19.  These findings were reviewed with the patient.  We elected to proceed with percutaneous intervention of the right coronary artery.  The patient had received aspirin and heparin and was given ReoPro on a weight-adjusted basis to maintain an ACT of greater than 250 seconds.  In addition, 300 mg of Plavix was given orally.  A 7-French JR4 guide catheter was then used to engage the right coronary artery, and a 0.014-inch Hi-Torque Floppy wire was advanced within a 3.0 x 20-mm Maverick balloon.  The Floppy wire was  carefully advanced in the distal RCA, and the balloon was positioned in the distal segment of the vessel.  Manual injection of contrast through the balloon lumen showed distal filling of the RCA confirming positioning of the wire and balloon within the true lumen of the vessel.  The Maverick balloon was then inflated within the mid section of the RCA at the site of the stenosis.  Two inflations were performed at six atmospheres for 30 seconds.  Repeat angiography showed no improvement of flow.  Five additional inflations were performed in the mid RCA at up to 10 atmospheres for 60 seconds with angiography finally revealing improved flow through the RCA and recanalization of the vessel.  This is a large caliber vessel with a severe narrowing in the mid section and considerable vessel recoil.  A Solaris 3.5 x 30-mm balloon was introduced and used to further dilate the mid section of the RCA at 10 atmospheres for 60 seconds.  Repeat angiography showed approximately 30-40% residual narrowing and no evidence of vessel damage; however, due to significant recoil noted previously, we elected to proceed with stenting of the vessel.  A 4.0 x 28-mm Ultra stent was carefully positioned in the mid RCA extending up to the RV marginal branch and deployed at 12 atmospheres for 60 seconds.  A 4.0 x 20-mm Quantum Ranger balloon was then used to post dilate the distal and proximal segments of the stent.  Two inflations at 16 atmospheres for 30 seconds were performed.  Repeat angiography after the administration of intracoronary nitroglycerin showed an excellent result with no residual stenosis and TIMI-3 flow throughout the right coronary artery.  There was no evidence of distal vessel damage or thromboembolic phenomena.  Final angiography was performed in various projections confirming persistence of benefit and TIMI-3 flow throughout the right coronary artery.  The guide catheter was then removed, and the  sheath was secured into position.   The patient tolerated the procedure well and was transferred to the holding area in stable condition.  FINAL RESULTS:  Successful PTCA and stenting of the mid right coronary artery  with reduction of 100% narrowing to 0% with placement of a 4.0 x 28-mm Ultra stent with improvement of TIMI-0 to TIMI-3 flow. DD:  10/07/99 TD:  10/07/99 Job: [id number]

## 2010-06-07 ENCOUNTER — Ambulatory Visit (INDEPENDENT_AMBULATORY_CARE_PROVIDER_SITE_OTHER): Payer: BC Managed Care – PPO | Admitting: Family Medicine

## 2010-06-07 DIAGNOSIS — E538 Deficiency of other specified B group vitamins: Secondary | ICD-10-CM

## 2010-06-07 MED ORDER — CYANOCOBALAMIN 1000 MCG/ML IJ SOLN
1000.0000 ug | Freq: Once | INTRAMUSCULAR | Status: AC
  Administered 2010-06-07: 1000 ug via INTRAMUSCULAR

## 2010-06-18 ENCOUNTER — Telehealth: Payer: Self-pay | Admitting: Family Medicine

## 2010-06-18 NOTE — Telephone Encounter (Signed)
Yes okay to work in. 

## 2010-06-18 NOTE — Telephone Encounter (Signed)
Pt would like a cpx this month. Can I work pt in?

## 2010-06-19 NOTE — Telephone Encounter (Signed)
Pt is sch for cpx on 07-02-2010 315pm also labs on 06-26-2010 9am. Pt is aware

## 2010-06-26 ENCOUNTER — Other Ambulatory Visit: Payer: BC Managed Care – PPO

## 2010-06-27 ENCOUNTER — Encounter: Payer: Self-pay | Admitting: Cardiology

## 2010-06-28 ENCOUNTER — Other Ambulatory Visit: Payer: BC Managed Care – PPO

## 2010-06-29 ENCOUNTER — Other Ambulatory Visit (INDEPENDENT_AMBULATORY_CARE_PROVIDER_SITE_OTHER): Payer: BC Managed Care – PPO

## 2010-06-29 DIAGNOSIS — Z0000 Encounter for general adult medical examination without abnormal findings: Secondary | ICD-10-CM

## 2010-06-29 DIAGNOSIS — Z0389 Encounter for observation for other suspected diseases and conditions ruled out: Secondary | ICD-10-CM

## 2010-06-29 LAB — HEPATIC FUNCTION PANEL
ALT: 21 U/L (ref 0–53)
AST: 18 U/L (ref 0–37)
Albumin: 4.6 g/dL (ref 3.5–5.2)
Total Protein: 7.3 g/dL (ref 6.0–8.3)

## 2010-06-29 LAB — URINALYSIS, ROUTINE W REFLEX MICROSCOPIC
Bilirubin Urine: NEGATIVE
Urine Glucose: NEGATIVE
Urobilinogen, UA: 0.2 (ref 0.0–1.0)

## 2010-06-29 LAB — CBC WITH DIFFERENTIAL/PLATELET
Basophils Absolute: 0 10*3/uL (ref 0.0–0.1)
Basophils Relative: 0.3 % (ref 0.0–3.0)
Eosinophils Absolute: 0.3 10*3/uL (ref 0.0–0.7)
Lymphocytes Relative: 17.3 % (ref 12.0–46.0)
MCHC: 34.9 g/dL (ref 30.0–36.0)
MCV: 95.5 fl (ref 78.0–100.0)
Monocytes Absolute: 0.7 10*3/uL (ref 0.1–1.0)
Neutro Abs: 5.1 10*3/uL (ref 1.4–7.7)
Neutrophils Relative %: 68.6 % (ref 43.0–77.0)
RBC: 4.26 Mil/uL (ref 4.22–5.81)
RDW: 13.4 % (ref 11.5–14.6)

## 2010-06-29 LAB — BASIC METABOLIC PANEL
CO2: 26 mEq/L (ref 19–32)
Calcium: 9.1 mg/dL (ref 8.4–10.5)
Chloride: 108 mEq/L (ref 96–112)
Creatinine, Ser: 0.8 mg/dL (ref 0.4–1.5)
Glucose, Bld: 115 mg/dL — ABNORMAL HIGH (ref 70–99)

## 2010-06-29 LAB — LDL CHOLESTEROL, DIRECT: Direct LDL: 75.2 mg/dL

## 2010-06-29 LAB — LIPID PANEL
Cholesterol: 150 mg/dL (ref 0–200)
Triglycerides: 280 mg/dL — ABNORMAL HIGH (ref 0.0–149.0)

## 2010-07-02 ENCOUNTER — Encounter: Payer: BC Managed Care – PPO | Admitting: Family Medicine

## 2010-07-02 ENCOUNTER — Telehealth: Payer: Self-pay | Admitting: *Deleted

## 2010-07-02 NOTE — Telephone Encounter (Signed)
Message copied by Trenton Gammon on Mon Jul 02, 2010  4:23 PM ------      Message from: [name] A      Created: Mon Jul 02, 2010  8:46 AM       Normal except high TG and mildly high glucose. Stay on current meds and watch the carbohydrate intake

## 2010-07-04 ENCOUNTER — Ambulatory Visit (INDEPENDENT_AMBULATORY_CARE_PROVIDER_SITE_OTHER): Payer: BC Managed Care – PPO | Admitting: Family Medicine

## 2010-07-04 ENCOUNTER — Encounter: Payer: Self-pay | Admitting: Family Medicine

## 2010-07-04 VITALS — BP 152/82 | HR 92 | Temp 98.5°F | Ht 65.5 in | Wt 198.0 lb

## 2010-07-04 DIAGNOSIS — E538 Deficiency of other specified B group vitamins: Secondary | ICD-10-CM

## 2010-07-04 DIAGNOSIS — Z0000 Encounter for general adult medical examination without abnormal findings: Secondary | ICD-10-CM

## 2010-07-04 MED ORDER — BUPROPION HCL ER (SR) 150 MG PO TB12: 150.0000 mg | ORAL_TABLET | Freq: Two times a day (BID) | ORAL | Status: DC

## 2010-07-04 MED ORDER — CYANOCOBALAMIN 1000 MCG/ML IJ SOLN
1000.0000 ug | Freq: Once | INTRAMUSCULAR | Status: AC
  Administered 2010-07-04: 1000 ug via INTRAMUSCULAR

## 2010-07-04 MED ORDER — CVS FISH OIL 1000 MG PO CAPS: 1.0000 | ORAL_CAPSULE | Freq: Every day | ORAL | Status: AC

## 2010-07-04 MED ORDER — INDOMETHACIN 50 MG PO CAPS: 50.0000 mg | ORAL_CAPSULE | Freq: Three times a day (TID) | ORAL | Status: DC

## 2010-07-04 MED ORDER — ZOLPIDEM TARTRATE 10 MG PO TABS: 10.0000 mg | ORAL_TABLET | Freq: Every evening | ORAL | Status: AC | PRN

## 2010-07-04 MED ORDER — ROSUVASTATIN CALCIUM 20 MG PO TABS: 20.0000 mg | ORAL_TABLET | Freq: Every day | ORAL | Status: DC

## 2010-07-04 MED ORDER — LISINOPRIL-HYDROCHLOROTHIAZIDE 20-25 MG PO TABS: 1.0000 | ORAL_TABLET | Freq: Every day | ORAL | Status: DC

## 2010-07-04 MED ORDER — TADALAFIL 20 MG PO TABS: 20.0000 mg | ORAL_TABLET | Freq: Every day | ORAL | Status: DC | PRN

## 2010-07-04 NOTE — Progress Notes (Signed)
  Subjective:    Patient ID: Mitchell [name], male    DOB: [birth year], 65 y.o.   MRN: [id number]  HPI 65 yr old male for a cpx. He feels well but admits to eating the wrong foods and not getting much exercise.    Review of Systems  Constitutional: Negative.   HENT: Negative.   Eyes: Negative.   Respiratory: Negative.   Cardiovascular: Negative.   Gastrointestinal: Negative.   Genitourinary: Negative.   Musculoskeletal: Negative.   Skin: Negative.   Neurological: Negative.   Hematological: Negative.   Psychiatric/Behavioral: Negative.        Objective:   Physical Exam  Constitutional: He is oriented to person, place, and time. He appears well-developed and well-nourished. No distress.  HENT:  Head: Normocephalic and atraumatic.  Right Ear: External ear normal.  Left Ear: External ear normal.  Nose: Nose normal.  Mouth/Throat: Oropharynx is clear and moist. No oropharyngeal exudate.  Eyes: Conjunctivae and EOM are normal. Pupils are equal, round, and reactive to light. Right eye exhibits no discharge. Left eye exhibits no discharge. No scleral icterus.  Neck: Neck supple. No JVD present. No tracheal deviation present. No thyromegaly present.  Cardiovascular: Normal rate, regular rhythm, normal heart sounds and intact distal pulses.  Exam reveals no gallop and no friction rub.   No murmur heard.      EKG normal   Pulmonary/Chest: Effort normal and breath sounds normal. No respiratory distress. He has no wheezes. He has no rales. He exhibits no tenderness.  Abdominal: Soft. Bowel sounds are normal. He exhibits no distension and no mass. There is no tenderness. There is no rebound and no guarding.  Genitourinary: Rectum normal, prostate normal and penis normal. Guaiac negative stool. No penile tenderness.  Musculoskeletal: Normal range of motion. He exhibits no edema and no tenderness.  Lymphadenopathy:    He has no cervical adenopathy.  Neurological: He is alert and oriented to  person, place, and time. He has normal reflexes. No cranial nerve deficit. He exhibits normal muscle tone. Coordination normal.  Skin: Skin is warm and dry. No rash noted. He is not diaphoretic. No erythema. No pallor.  Psychiatric: He has a normal mood and affect. His behavior is normal. Judgment and thought content normal.          Assessment & Plan:  He is overweight, and we discussed eating a smarter diet and exercising. Switch from Ramipril to Lisinopril HCT. Resume fish oil capsules daily.

## 2010-07-05 ENCOUNTER — Ambulatory Visit: Payer: BC Managed Care – PPO | Admitting: Family Medicine

## 2010-08-02 ENCOUNTER — Ambulatory Visit (INDEPENDENT_AMBULATORY_CARE_PROVIDER_SITE_OTHER): Payer: BC Managed Care – PPO | Admitting: Family Medicine

## 2010-08-02 ENCOUNTER — Telehealth: Payer: Self-pay | Admitting: Family Medicine

## 2010-08-02 DIAGNOSIS — E538 Deficiency of other specified B group vitamins: Secondary | ICD-10-CM

## 2010-08-02 DIAGNOSIS — E539 Vitamin B deficiency, unspecified: Secondary | ICD-10-CM

## 2010-08-02 MED ORDER — CYANOCOBALAMIN 1000 MCG/ML IJ SOLN
1000.0000 ug | Freq: Once | INTRAMUSCULAR | Status: AC
  Administered 2010-08-02: 1000 ug via INTRAMUSCULAR

## 2010-08-02 NOTE — Telephone Encounter (Signed)
Pt would like to know how long will he get the Vit B 12 injections?

## 2010-08-03 NOTE — Telephone Encounter (Signed)
First we need to check a blood level, and then we can decide. Set up a B12 test

## 2010-08-07 NOTE — Telephone Encounter (Signed)
Lab order was put into computer and pt notified for appointment.

## 2010-08-07 NOTE — Telephone Encounter (Signed)
LVMFP

## 2010-08-09 ENCOUNTER — Other Ambulatory Visit (INDEPENDENT_AMBULATORY_CARE_PROVIDER_SITE_OTHER): Payer: BC Managed Care – PPO

## 2010-08-09 DIAGNOSIS — E538 Deficiency of other specified B group vitamins: Secondary | ICD-10-CM

## 2010-08-16 ENCOUNTER — Ambulatory Visit: Payer: BC Managed Care – PPO | Admitting: Cardiology

## 2010-09-06 ENCOUNTER — Ambulatory Visit (INDEPENDENT_AMBULATORY_CARE_PROVIDER_SITE_OTHER): Payer: BC Managed Care – PPO | Admitting: Family Medicine

## 2010-09-06 DIAGNOSIS — E539 Vitamin B deficiency, unspecified: Secondary | ICD-10-CM

## 2010-09-06 MED ORDER — CYANOCOBALAMIN 1000 MCG/ML IJ SOLN
1000.0000 ug | Freq: Once | INTRAMUSCULAR | Status: AC
  Administered 2010-09-06: 1000 ug via INTRAMUSCULAR

## 2010-09-21 ENCOUNTER — Ambulatory Visit (INDEPENDENT_AMBULATORY_CARE_PROVIDER_SITE_OTHER): Payer: BC Managed Care – PPO | Admitting: Cardiology

## 2010-09-21 ENCOUNTER — Encounter: Payer: Self-pay | Admitting: Cardiology

## 2010-09-21 VITALS — BP 122/72 | HR 81 | Ht 66.0 in | Wt 196.0 lb

## 2010-09-21 DIAGNOSIS — I10 Essential (primary) hypertension: Secondary | ICD-10-CM

## 2010-09-21 DIAGNOSIS — E785 Hyperlipidemia, unspecified: Secondary | ICD-10-CM

## 2010-09-21 DIAGNOSIS — I2510 Atherosclerotic heart disease of native coronary artery without angina pectoris: Secondary | ICD-10-CM

## 2010-09-21 NOTE — Assessment & Plan Note (Signed)
Continue statin. Lipids and liver monitored by primary care. 

## 2010-09-21 NOTE — Progress Notes (Signed)
HPI: [name] is a gentleman who has a history of coronary artery disease.  In September 2001, he underwent cardiac catheterization in the setting of a myocardial infarction.  He was found to have moderate coronary disease in the left system.  There was 100% right coronary artery.  His ejection fraction was 50%.  He had PTCA/stenting of the right coronary artery at that time. His last Myoview was performed on July 16, 2007. At that time he had a prior mid to basal inferolateral scar but no ischemia. Ejection fraction was 48%. He also had an abdominal ultrasound on July 16, 2007 that showed no aneurysm. I last saw him in June of 2010. Since then he has mild dyspnea on exertion but there is no orthopnea, PND, pedal edema, palpitations or syncope. He's had no chest pain.  Current Outpatient Prescriptions  Medication Sig Dispense Refill  . aspirin 325 MG tablet Take 325 mg by mouth daily.        Marland Kitchen buPROPion (WELLBUTRIN SR) 150 MG 12 hr tablet Take 1 tablet (150 mg total) by mouth 2 (two) times daily.  60 tablet  11  . lisinopril-hydrochlorothiazide (PRINZIDE,ZESTORETIC) 20-25 MG per tablet Take 1 tablet by mouth daily.  30 tablet  11  . naproxen sodium (ANAPROX) 220 MG tablet Take 220 mg by mouth. As needed       . Omega-3 Fatty Acids ([employer] FISH OIL) 1000 MG CAPS Take 1 capsule by mouth daily.  1 capsule  0  . rosuvastatin (Mitchell [name]) 20 MG tablet Take 1 tablet (20 mg total) by mouth daily.  30 tablet  11  . tadalafil (Mitchell [name]) 20 MG tablet Take 1 tablet (20 mg total) by mouth daily as needed.  10 tablet  11     Past Medical History  Diagnosis Date  . Sleep apnea     Dr Shelle Iron  . Hyperlipemia   . Hypertension   . Myocardial infarction     Dr Jens Som  . CAD (coronary artery disease)   . Vitamin B 12 deficiency   . Depression   . ED (erectile dysfunction)   . Gout     Past Surgical History  Procedure Date  . Tonsillectomy   . Angioplasty     stent placement  . Vasectomy reversal   . Knee  surgery   . Strabimus     repair  . Cervical laminectomy   . Colonoscopy 07-06-08    per Dr. Jarold Motto, repeat in 3 yrs    History   Social History  . Marital Status: Married    Spouse Name: N/A    Number of Children: N/A  . Years of Education: N/A   Occupational History  . Not on file.   Social History Main Topics  . Smoking status: Former Smoker -- 1.0 packs/day for 49 years    Types: Cigarettes    Quit date: 01/15/2008  . Smokeless tobacco: Not on file  . Alcohol Use: Yes  . Drug Use: No  . Sexually Active: Not on file   Other Topics Concern  . Not on file   Social History Narrative  . No narrative on file    ROS: no fevers or chills, productive cough, hemoptysis, dysphasia, odynophagia, melena, hematochezia, dysuria, hematuria, rash, seizure activity, orthopnea, PND, pedal edema, claudication. Remaining systems are negative.  Physical Exam: Well-developed well-nourished in no acute distress.  Skin is warm and dry.  HEENT is normal.  Neck is supple. No thyromegaly.  Chest is clear to  auscultation with normal expansion.  Cardiovascular exam is regular rate and rhythm.  Abdominal exam nontender or distended. No masses palpated. Extremities show no edema. neuro grossly intact

## 2010-09-21 NOTE — Assessment & Plan Note (Signed)
Continue aspirin, ACE inhibitor and statin. Schedule Myoview for risk stratification.

## 2010-09-21 NOTE — Assessment & Plan Note (Signed)
Blood pressure controlled. Continue present medications. Potassium and renal function monitored by primary care. 

## 2010-09-21 NOTE — Patient Instructions (Signed)
Your physician recommends that you schedule a follow-up appointment [id number] WITH DR Jens Som Your physician recommends that you continue on your current medications as directed. Please refer to the Current Medication list given to you todaY.  Your physician has requested that you have en exercise stress myoview. For further information please visit [email or web address] Please follow instruction sheet, as given. PT'S CONVENIENCE DX [phone]

## 2010-09-25 ENCOUNTER — Encounter: Payer: Self-pay | Admitting: *Deleted

## 2010-10-04 ENCOUNTER — Encounter (HOSPITAL_COMMUNITY): Payer: BC Managed Care – PPO | Admitting: Radiology

## 2010-10-04 ENCOUNTER — Ambulatory Visit: Payer: BC Managed Care – PPO | Admitting: Family Medicine

## 2010-10-05 ENCOUNTER — Ambulatory Visit (INDEPENDENT_AMBULATORY_CARE_PROVIDER_SITE_OTHER): Payer: BC Managed Care – PPO | Admitting: Family Medicine

## 2010-10-05 DIAGNOSIS — E538 Deficiency of other specified B group vitamins: Secondary | ICD-10-CM

## 2010-10-05 MED ORDER — CYANOCOBALAMIN 1000 MCG/ML IJ SOLN
1000.0000 ug | Freq: Once | INTRAMUSCULAR | Status: AC
  Administered 2010-10-05: 1000 ug via INTRAMUSCULAR

## 2010-10-23 ENCOUNTER — Ambulatory Visit (HOSPITAL_COMMUNITY): Payer: BC Managed Care – PPO | Attending: Cardiology | Admitting: Radiology

## 2010-10-23 VITALS — Ht 66.0 in | Wt 200.0 lb

## 2010-10-23 DIAGNOSIS — R0602 Shortness of breath: Secondary | ICD-10-CM

## 2010-10-23 DIAGNOSIS — I2510 Atherosclerotic heart disease of native coronary artery without angina pectoris: Secondary | ICD-10-CM

## 2010-10-23 DIAGNOSIS — I252 Old myocardial infarction: Secondary | ICD-10-CM

## 2010-10-23 DIAGNOSIS — R079 Chest pain, unspecified: Secondary | ICD-10-CM

## 2010-10-23 DIAGNOSIS — R0989 Other specified symptoms and signs involving the circulatory and respiratory systems: Secondary | ICD-10-CM

## 2010-10-23 DIAGNOSIS — R0609 Other forms of dyspnea: Secondary | ICD-10-CM

## 2010-10-23 MED ORDER — TECHNETIUM TC 99M TETROFOSMIN IV KIT
33.0000 | PACK | Freq: Once | INTRAVENOUS | Status: AC | PRN
  Administered 2010-10-23: 33 via INTRAVENOUS

## 2010-10-23 MED ORDER — TECHNETIUM TC 99M TETROFOSMIN IV KIT
11.0000 | PACK | Freq: Once | INTRAVENOUS | Status: AC | PRN
  Administered 2010-10-23: 11 via INTRAVENOUS

## 2010-10-23 NOTE — Progress Notes (Signed)
[facility] NUCLEAR MED [address] [place] [address] [phone]  Cardiology Nuclear Med Study  Mitchell [name] is a 65 y.o. male [id number] [birth year]   Nuclear Med Background Indication for Stress Test:  Evaluation for Ischemia, Stent Patency and PTCA Patency History:  Abnormal EKG, '01 Angioplasty: RCA, COPD, '01 Heart Catheterization: EF 50% 100% RCA mod. DZ, '01 Myocardial Infarction: DMI, 07/16/07 Myocardial Perfusion Study: EF 48% (-) ischemia abn inferolateral scar  and '01 Stents: RCA Cardiac Risk Factors: Family History - CAD, Hypertension, Lipids and Smoker  Symptoms:  Chest Pain, Dizziness, DOE, Palpitations and SOB   Nuclear Pre-Procedure Caffeine/Decaff Intake:  None NPO After: 11:00pm   Lungs:  clear IV 0.9% NS with Angio Cath:  20g  IV Site: R Hand  IV Started by:  Bonnita Levan, RN  Chest Size (in):  44 Cup Size: n/a  Height: 5\' 6"  (1.676 m)  Weight:  200 lb (90.719 kg)  BMI:  Body mass index is 32.28 kg/(m^2). Tech Comments:  N/A    Nuclear Med Study 1 or 2 day study: 1 day  Stress Test Type:  Stress  Reading MD: Arvilla Meres, MD  Order Authorizing Provider:  B. Crenshaw  Resting Radionuclide: Technetium 46m Tetrofosmin  Resting Radionuclide Dose: 11.0 mCi   Stress Radionuclide:  Technetium 83m Tetrofosmin  Stress Radionuclide Dose: 33.0 mCi           Stress Protocol Rest HR: 61 Stress HR: 144  Rest BP: 122/62 Stress BP: 200/68  Exercise Time (min): 6:15 METS: 7.3   Predicted Max HR: 157 bpm % Max HR: 91.72 bpm Rate Pressure Product: 32440   Dose of Adenosine (mg):  n/a Dose of Lexiscan: n/a mg  Dose of Atropine (mg): n/a Dose of Dobutamine: n/a mcg/kg/min (at max HR)  Stress Test Technologist: Milana Na, EMT-P  Nuclear Technologist:  Domenic Polite, CNMT     Rest Procedure:  Myocardial perfusion imaging was performed at rest 45 minutes following the intravenous administration of Technetium 6m  Tetrofosmin. Rest ECG: NSR  Stress Procedure:  The patient exercised for 6:15.  The patient stopped due to fatigue and chest discomfort/tightness. Throughout recovery the chest tightness went away. There were no significant ST-T wave changes.  Technetium 76m Tetrofosmin was injected at peak exercise and myocardial perfusion imaging was performed after a brief delay. Stress ECG: Insignificant upsloping ST segment depression.  QPS Raw Data Images:  Normal; no motion artifact; normal heart/lung ratio. Stress Images:  Decreased uptake in inferior and inferolateral walls. Rest Images:  Decreased uptake in inferior and inferolateral walls. Subtraction (SDS):  Moderate predominantly fixed defect in inferior and inferolateral walls suggest of previous infarct. Very mild peri-infarct ischemia.  Transient Ischemic Dilatation (Normal <1.22):  1.01 Lung/Heart Ratio (Normal <0.45):  0.33  Quantitative Gated Spect Images QGS EDV:  103 ml QGS ESV:  46 ml QGS cine images:  Normal EF. Hypokinesis of inferior and inferolateral walls.  QGS EF: 55%  Impression Exercise Capacity:  Fair exercise capacity. BP Response:  Hypertensive blood pressure response. Clinical Symptoms:  Chest pain ECG Impression:  Insignificant upsloping ST segment depression. Comparison with Prior Nuclear Study: No significant change from previous study  Overall Impression:  Low risk stress nuclear study. Moderate predominantly fixed defect in inferior and inferolateral walls suggest of previous infarct. Very mild peri-infarct ischemia.    Maryfrances Portugal

## 2010-11-01 ENCOUNTER — Ambulatory Visit (INDEPENDENT_AMBULATORY_CARE_PROVIDER_SITE_OTHER): Payer: BC Managed Care – PPO | Admitting: Family Medicine

## 2010-11-01 DIAGNOSIS — E539 Vitamin B deficiency, unspecified: Secondary | ICD-10-CM

## 2010-11-01 MED ORDER — CYANOCOBALAMIN 1000 MCG/ML IJ SOLN
1000.0000 ug | Freq: Once | INTRAMUSCULAR | Status: AC
  Administered 2010-11-01: 1000 ug via INTRAMUSCULAR

## 2010-12-04 ENCOUNTER — Ambulatory Visit: Payer: BC Managed Care – PPO | Admitting: Family Medicine

## 2011-01-17 ENCOUNTER — Ambulatory Visit (INDEPENDENT_AMBULATORY_CARE_PROVIDER_SITE_OTHER): Payer: BC Managed Care – PPO | Admitting: Family Medicine

## 2011-01-17 DIAGNOSIS — E539 Vitamin B deficiency, unspecified: Secondary | ICD-10-CM

## 2011-01-17 MED ORDER — CYANOCOBALAMIN 1000 MCG/ML IJ SOLN
1000.0000 ug | Freq: Once | INTRAMUSCULAR | Status: AC
  Administered 2011-01-17: 1000 ug via INTRAMUSCULAR

## 2011-02-19 ENCOUNTER — Ambulatory Visit (INDEPENDENT_AMBULATORY_CARE_PROVIDER_SITE_OTHER): Payer: BC Managed Care – PPO | Admitting: Family Medicine

## 2011-02-19 DIAGNOSIS — E538 Deficiency of other specified B group vitamins: Secondary | ICD-10-CM

## 2011-02-19 MED ORDER — CYANOCOBALAMIN 1000 MCG/ML IJ SOLN
1000.0000 ug | Freq: Once | INTRAMUSCULAR | Status: AC
  Administered 2011-02-19: 1000 ug via INTRAMUSCULAR

## 2011-04-26 ENCOUNTER — Ambulatory Visit (INDEPENDENT_AMBULATORY_CARE_PROVIDER_SITE_OTHER): Payer: BC Managed Care – PPO | Admitting: Family Medicine

## 2011-04-26 DIAGNOSIS — E539 Vitamin B deficiency, unspecified: Secondary | ICD-10-CM

## 2011-04-26 MED ORDER — CYANOCOBALAMIN 1000 MCG/ML IJ SOLN
1000.0000 ug | Freq: Once | INTRAMUSCULAR | Status: AC
  Administered 2011-04-26: 1000 ug via INTRAMUSCULAR

## 2011-05-28 ENCOUNTER — Ambulatory Visit (INDEPENDENT_AMBULATORY_CARE_PROVIDER_SITE_OTHER): Payer: BC Managed Care – PPO | Admitting: Family Medicine

## 2011-05-28 DIAGNOSIS — E538 Deficiency of other specified B group vitamins: Secondary | ICD-10-CM

## 2011-05-28 MED ORDER — CYANOCOBALAMIN 1000 MCG/ML IJ SOLN
1000.0000 ug | Freq: Once | INTRAMUSCULAR | Status: AC
  Administered 2011-05-28: 1000 ug via INTRAMUSCULAR

## 2011-06-13 ENCOUNTER — Telehealth: Payer: Self-pay | Admitting: Family Medicine

## 2011-06-13 NOTE — Telephone Encounter (Signed)
Okay to fill both for one year  

## 2011-06-13 NOTE — Telephone Encounter (Signed)
Refill request for Crestor 20 mg take 1 po qd and Lisinopril?HCTZ 20-25 mg take 1 po qd. Can I refill these?

## 2011-06-14 MED ORDER — ROSUVASTATIN CALCIUM 20 MG PO TABS: 20.0000 mg | ORAL_TABLET | Freq: Every day | ORAL | Status: DC

## 2011-06-14 MED ORDER — LISINOPRIL-HYDROCHLOROTHIAZIDE 20-25 MG PO TABS: 1.0000 | ORAL_TABLET | Freq: Every day | ORAL | Status: DC

## 2011-06-14 NOTE — Telephone Encounter (Signed)
RX done. 

## 2011-06-27 ENCOUNTER — Other Ambulatory Visit (INDEPENDENT_AMBULATORY_CARE_PROVIDER_SITE_OTHER): Payer: BC Managed Care – PPO

## 2011-06-27 DIAGNOSIS — Z0000 Encounter for general adult medical examination without abnormal findings: Secondary | ICD-10-CM

## 2011-06-27 DIAGNOSIS — E538 Deficiency of other specified B group vitamins: Secondary | ICD-10-CM

## 2011-06-27 LAB — POCT URINALYSIS DIPSTICK
Blood, UA: NEGATIVE
Nitrite, UA: NEGATIVE
Protein, UA: NEGATIVE
Spec Grav, UA: 1.015
Urobilinogen, UA: 0.2

## 2011-06-27 LAB — CBC WITH DIFFERENTIAL/PLATELET
Basophils Absolute: 0.1 10*3/uL (ref 0.0–0.1)
Basophils Relative: 0.9 % (ref 0.0–3.0)
Eosinophils Absolute: 0.3 10*3/uL (ref 0.0–0.7)
Lymphocytes Relative: 18.3 % (ref 12.0–46.0)
MCHC: 33.9 g/dL (ref 30.0–36.0)
MCV: 94.2 fl (ref 78.0–100.0)
Monocytes Absolute: 0.5 10*3/uL (ref 0.1–1.0)
Neutrophils Relative %: 66 % (ref 43.0–77.0)
Platelets: 158 10*3/uL (ref 150.0–400.0)
RBC: 3.76 Mil/uL — ABNORMAL LOW (ref 4.22–5.81)
RDW: 14.6 % (ref 11.5–14.6)

## 2011-06-27 LAB — LIPID PANEL
Cholesterol: 121 mg/dL (ref 0–200)
HDL: 29.5 mg/dL — ABNORMAL LOW (ref >39.00)
Total CHOL/HDL Ratio: 4
VLDL: 51.6 mg/dL — ABNORMAL HIGH (ref 0.0–40.0)

## 2011-06-27 LAB — HEPATIC FUNCTION PANEL
ALT: 18 U/L (ref 0–53)
AST: 18 U/L (ref 0–37)
Bilirubin, Direct: 0.1 mg/dL (ref 0.0–0.3)
Total Bilirubin: 0.7 mg/dL (ref 0.3–1.2)

## 2011-06-27 LAB — BASIC METABOLIC PANEL
BUN: 20 mg/dL (ref 6–23)
Chloride: 102 mEq/L (ref 96–112)
GFR: 67.26 mL/min (ref >60.00)
Potassium: 5.3 mEq/L — ABNORMAL HIGH (ref 3.5–5.1)

## 2011-06-27 LAB — TSH: TSH: 2.14 u[IU]/mL (ref 0.35–5.50)

## 2011-06-27 MED ORDER — CYANOCOBALAMIN 1000 MCG/ML IJ SOLN
1000.0000 ug | Freq: Once | INTRAMUSCULAR | Status: AC
  Administered 2011-06-27: 1000 ug via INTRAMUSCULAR

## 2011-06-28 ENCOUNTER — Other Ambulatory Visit: Payer: BC Managed Care – PPO

## 2011-07-02 ENCOUNTER — Encounter: Payer: Self-pay | Admitting: Family Medicine

## 2011-07-02 NOTE — Progress Notes (Signed)
Quick Note:  I tried to reach pt by phone and no answer. I put a copy of results in mail. ______ 

## 2011-07-05 ENCOUNTER — Ambulatory Visit (INDEPENDENT_AMBULATORY_CARE_PROVIDER_SITE_OTHER): Payer: BC Managed Care – PPO | Admitting: Family Medicine

## 2011-07-05 ENCOUNTER — Ambulatory Visit (INDEPENDENT_AMBULATORY_CARE_PROVIDER_SITE_OTHER)
Admission: RE | Admit: 2011-07-05 | Discharge: 2011-07-05 | Disposition: A | Discharge status: Home / Self Care | Payer: BC Managed Care – PPO | Source: Ambulatory Visit | Attending: Family Medicine | Admitting: Family Medicine

## 2011-07-05 ENCOUNTER — Encounter: Payer: Self-pay | Admitting: Family Medicine

## 2011-07-05 VITALS — BP 140/90 | HR 90 | Temp 98.6°F | Ht 65.0 in | Wt 199.0 lb

## 2011-07-05 DIAGNOSIS — Z0000 Encounter for general adult medical examination without abnormal findings: Secondary | ICD-10-CM

## 2011-07-05 NOTE — Progress Notes (Signed)
  Subjective:    Patient ID: Mitchell [name], male    DOB: 10/23/1945, 66 y.o.   MRN: [id number]  HPI 66 yr old male for a cpx. He feels fine and has no concerns. He admits to not exercising much lately.    Review of Systems  Constitutional: Negative.   HENT: Negative.   Eyes: Negative.   Respiratory: Negative.   Cardiovascular: Negative.   Gastrointestinal: Negative.   Genitourinary: Negative.   Musculoskeletal: Negative.   Skin: Negative.   Neurological: Negative.   Hematological: Negative.   Psychiatric/Behavioral: Negative.        Objective:   Physical Exam  Constitutional: He is oriented to person, place, and time. He appears well-developed and well-nourished. No distress.  HENT:  Head: Normocephalic and atraumatic.  Right Ear: External ear normal.  Left Ear: External ear normal.  Nose: Nose normal.  Mouth/Throat: Oropharynx is clear and moist. No oropharyngeal exudate.  Eyes: Conjunctivae and EOM are normal. Pupils are equal, round, and reactive to light. Right eye exhibits no discharge. Left eye exhibits no discharge. No scleral icterus.  Neck: Neck supple. No JVD present. No tracheal deviation present. No thyromegaly present.  Cardiovascular: Normal rate, regular rhythm, normal heart sounds and intact distal pulses.  Exam reveals no gallop and no friction rub.   No murmur heard.      EKG normal   Pulmonary/Chest: Effort normal and breath sounds normal. No respiratory distress. He has no wheezes. He has no rales. He exhibits no tenderness.  Abdominal: Soft. Bowel sounds are normal. He exhibits no distension and no mass. There is no tenderness. There is no rebound and no guarding.  Genitourinary: Rectum normal, prostate normal and penis normal. Guaiac negative stool. No penile tenderness.  Musculoskeletal: Normal range of motion. He exhibits no edema and no tenderness.  Lymphadenopathy:    He has no cervical adenopathy.  Neurological: He is alert and oriented to person,  place, and time. He has normal reflexes. No cranial nerve deficit. He exhibits normal muscle tone. Coordination normal.  Skin: Skin is warm and dry. No rash noted. He is not diaphoretic. No erythema. No pallor.  Psychiatric: He has a normal mood and affect. His behavior is normal. Judgment and thought content normal.          Assessment & Plan:  Well exam. Get a CXR today since he is a former smoker. Set up another colonoscopy. Advised him to take a fish oil capsule daily to get his TG down and his HDL up.

## 2011-07-08 NOTE — Progress Notes (Signed)
Quick Note:  I left voice message with normal results. ______ 

## 2011-07-11 ENCOUNTER — Telehealth: Payer: Self-pay | Admitting: Family Medicine

## 2011-07-11 MED ORDER — INDOMETHACIN 50 MG PO CAPS: 50.0000 mg | ORAL_CAPSULE | Freq: Three times a day (TID) | ORAL | Status: AC

## 2011-07-11 NOTE — Telephone Encounter (Signed)
Pt following up from Pharmacy's request for Gout medication refill, pt uncertain of name.  Per Pt, Pharmacy sent request on 6-24.  Pt leaves out of town on 6-28, would like RX called into [place], [place].  Please f/u w/ Pt. [phone].

## 2011-07-11 NOTE — Telephone Encounter (Signed)
I sent script e-scribe for Indomethacin and I spoke with pt.

## 2011-07-11 NOTE — Telephone Encounter (Signed)
Pt is aware MD out of office this afternoon. Phone note routed to nurse

## 2011-07-30 ENCOUNTER — Ambulatory Visit (INDEPENDENT_AMBULATORY_CARE_PROVIDER_SITE_OTHER): Payer: BC Managed Care – PPO | Admitting: Family Medicine

## 2011-07-30 DIAGNOSIS — E539 Vitamin B deficiency, unspecified: Secondary | ICD-10-CM

## 2011-07-30 MED ORDER — CYANOCOBALAMIN 1000 MCG/ML IJ SOLN
1000.0000 ug | Freq: Once | INTRAMUSCULAR | Status: AC
  Administered 2011-07-30: 1000 ug via INTRAMUSCULAR

## 2011-08-08 ENCOUNTER — Ambulatory Visit (AMBULATORY_SURGERY_CENTER): Payer: BC Managed Care – PPO | Admitting: *Deleted

## 2011-08-08 ENCOUNTER — Encounter: Payer: Self-pay | Admitting: Gastroenterology

## 2011-08-08 ENCOUNTER — Telehealth: Payer: Self-pay | Admitting: Gastroenterology

## 2011-08-08 VITALS — Ht 65.5 in | Wt 195.5 lb

## 2011-08-08 DIAGNOSIS — Z1211 Encounter for screening for malignant neoplasm of colon: Secondary | ICD-10-CM

## 2011-08-08 MED ORDER — MOVIPREP 100 G PO SOLR: ORAL | Status: DC

## 2011-08-08 NOTE — Telephone Encounter (Signed)
Patient changed procedure date and time.  Needs new prep times.

## 2011-08-08 NOTE — Telephone Encounter (Signed)
No answer. Will try pt later 

## 2011-08-08 NOTE — Telephone Encounter (Signed)
Reviewed updated instructions with pt.

## 2011-08-23 ENCOUNTER — Ambulatory Visit (AMBULATORY_SURGERY_CENTER): Payer: BC Managed Care – PPO | Admitting: Gastroenterology

## 2011-08-23 ENCOUNTER — Encounter: Payer: BC Managed Care – PPO | Admitting: Gastroenterology

## 2011-08-23 ENCOUNTER — Encounter: Payer: Self-pay | Admitting: Gastroenterology

## 2011-08-23 VITALS — BP 139/75 | HR 78 | Temp 98.8°F | Resp 18 | Ht 65.0 in | Wt 199.0 lb

## 2011-08-23 DIAGNOSIS — K5730 Diverticulosis of large intestine without perforation or abscess without bleeding: Secondary | ICD-10-CM

## 2011-08-23 DIAGNOSIS — Z1211 Encounter for screening for malignant neoplasm of colon: Secondary | ICD-10-CM

## 2011-08-23 DIAGNOSIS — Z86010 Personal history of colonic polyps: Secondary | ICD-10-CM

## 2011-08-23 MED ORDER — SODIUM CHLORIDE 0.9 % IV SOLN: 500.0000 mL | INTRAVENOUS | Status: DC

## 2011-08-23 NOTE — Op Note (Signed)
[facility] [address] [place], [place]  [address]  COLONOSCOPY PROCEDURE REPORT  PATIENT:  [name], Mitchell  MR#:  [id number] BIRTHDATE:  April 06, 1945, 65 yrs. old  GENDER:  male ENDOSCOPIST:  Mitchell Rea. Jarold Motto, MD, [facility] REF. BY: PROCEDURE DATE:  08/23/2011 PROCEDURE:  Diagnostic Colonoscopy ASA CLASS:  Class II INDICATIONS:  history of pre-cancerous (adenomatous) colon polyps  MEDICATIONS:   propofol (Diprivan) 150 mg IV  DESCRIPTION OF PROCEDURE:   After the risks and benefits and of the procedure were explained, informed consent was obtained. Digital rectal exam was performed and revealed no abnormalities. The LB CF-[id number] endoscope was introduced through the anus and advanced to the cecum, which was identified by both the appendix and ileocecal valve.  The quality of the prep was excellent, using MoviPrep.  The instrument was then slowly withdrawn as the colon was fully examined. <<PROCEDUREIMAGES>>  FINDINGS:  There were mild diverticular changes in left colon. diverticulosis was found.  No polyps or cancers were seen.  This was otherwise a normal examination of the colon.   Retroflexed views in the rectum revealed no abnormalities.    The scope was then withdrawn from the patient and the procedure completed.  COMPLICATIONS:  None ENDOSCOPIC IMPRESSION: 1) Diverticulosis,mild,left sided diverticulosis 2) No polyps or cancers RECOMMENDATIONS: 1) Continue Surveillance 2) Repeat colonoscopy in 5 years.  REPEAT EXAM:  No  ______________________________ Mitchell Rea. Jarold Motto, MD, Clement[facility] Graham  CC:  Nelwyn Salisbury, MD  n. Rosalie DoctorMarland Kitchen   Mitchell Rea. Patterson at 08/23/2011 10:44 AM  Lutricia Feil, [phone]

## 2011-08-23 NOTE — Progress Notes (Signed)
Patient did not experience any of the following events: a burn prior to discharge; a fall within the facility; wrong site/side/patient/procedure/implant event; or a hospital transfer or hospital admission upon discharge from the facility. (G8907) Patient did not have preoperative order for IV antibiotic SSI prophylaxis. (G8918)  

## 2011-08-23 NOTE — Patient Instructions (Addendum)
YOU HAD AN ENDOSCOPIC PROCEDURE TODAY AT THE [facility]: Refer to the procedure report that was given to you for any specific questions about what was found during the examination.  If the procedure report does not answer your questions, please call your gastroenterologist to clarify.  If you requested that your care partner not be given the details of your procedure findings, then the procedure report has been included in a sealed envelope for you to review at your convenience later.  YOU SHOULD EXPECT: Some feelings of bloating in the abdomen. Passage of more gas than usual.  Walking can help get rid of the air that was put into your GI tract during the procedure and reduce the bloating. If you had a lower endoscopy (such as a colonoscopy or flexible sigmoidoscopy) you may notice spotting of blood in your stool or on the toilet paper. If you underwent a bowel prep for your procedure, then you may not have a normal bowel movement for a few days.  DIET: Your first meal following the procedure should be a light meal and then it is ok to progress to your normal diet.  A half-sandwich or bowl of soup is an example of a good first meal.  Heavy or fried foods are harder to digest and may make you feel nauseous or bloated.  Likewise meals heavy in dairy and vegetables can cause extra gas to form and this can also increase the bloating.  Drink plenty of fluids but you should avoid alcoholic beverages for 24 hours.  ACTIVITY: Your care partner should take you home directly after the procedure.  You should plan to take it easy, moving slowly for the rest of the day.  You can resume normal activity the day after the procedure however you should NOT DRIVE or use heavy machinery for 24 hours (because of the sedation medicines used during the test).    SYMPTOMS TO REPORT IMMEDIATELY: A gastroenterologist can be reached at any hour.  During normal business hours, 8:30 AM to 5:00 PM Monday through Friday,  call [phone].  After hours and on weekends, please call the GI answering service at [phone] who will take a message and have the physician on call contact you.   Following lower endoscopy (colonoscopy or flexible sigmoidoscopy):  Excessive amounts of blood in the stool  Significant tenderness or worsening of abdominal pains  Swelling of the abdomen that is new, acute  Fever of 100F or higher  FOLLOW UP: Our staff will call the home number listed on your records the next business day following your procedure to check on you and address any questions or concerns that you may have at that time regarding the information given to you following your procedure. This is a courtesy call and so if there is no answer at the home number and we have not heard from you through the emergency physician on call, we will assume that you have returned to your regular daily activities without incident.  SIGNATURES/CONFIDENTIALITY: You and/or your care partner have signed paperwork which will be entered into your electronic medical record.  These signatures attest to the fact that that the information above on your After Visit Summary has been reviewed and is understood.  Full responsibility of the confidentiality of this discharge information lies with you and/or your care-partner.   Repeat colonoscopy in 5 years  Ok to resume your normal medications

## 2011-08-26 ENCOUNTER — Telehealth: Payer: Self-pay | Admitting: *Deleted

## 2011-08-26 NOTE — Telephone Encounter (Signed)
  Follow up Call-  Call back number 08/23/2011  Post procedure Call Back phone  # [phone]  Permission to leave phone message Yes     Patient questions:  Do you have a fever, pain , or abdominal swelling? no Pain Score  0 *  Have you tolerated food without any problems? yes  Have you been able to return to your normal activities? yes  Do you have any questions about your discharge instructions: Diet   no Medications  no Follow up visit  no  Do you have questions or concerns about your Care? no  Actions: * If pain score is 4 or above: No action needed, pain <4.

## 2011-08-28 ENCOUNTER — Other Ambulatory Visit: Payer: Self-pay | Admitting: Family Medicine

## 2011-08-29 ENCOUNTER — Ambulatory Visit (INDEPENDENT_AMBULATORY_CARE_PROVIDER_SITE_OTHER): Payer: BC Managed Care – PPO | Admitting: Family Medicine

## 2011-08-29 DIAGNOSIS — E530 Riboflavin deficiency: Secondary | ICD-10-CM

## 2011-08-29 MED ORDER — CYANOCOBALAMIN 1000 MCG/ML IJ SOLN
1000.0000 ug | Freq: Once | INTRAMUSCULAR | Status: AC
  Administered 2011-08-29: 1000 ug via INTRAMUSCULAR

## 2011-08-30 ENCOUNTER — Encounter: Payer: BC Managed Care – PPO | Admitting: Gastroenterology

## 2011-10-01 ENCOUNTER — Ambulatory Visit (INDEPENDENT_AMBULATORY_CARE_PROVIDER_SITE_OTHER): Payer: BC Managed Care – PPO | Admitting: Family Medicine

## 2011-10-01 DIAGNOSIS — E538 Deficiency of other specified B group vitamins: Secondary | ICD-10-CM

## 2011-10-01 MED ORDER — CYANOCOBALAMIN 1000 MCG/ML IJ SOLN
1000.0000 ug | Freq: Once | INTRAMUSCULAR | Status: AC
  Administered 2011-10-01: 1000 ug via INTRAMUSCULAR

## 2011-10-08 ENCOUNTER — Other Ambulatory Visit: Payer: Self-pay | Admitting: Family Medicine

## 2011-10-08 NOTE — Telephone Encounter (Signed)
Call in #10 with 11 rf 

## 2011-10-11 ENCOUNTER — Telehealth: Payer: Self-pay | Admitting: Family Medicine

## 2011-10-11 NOTE — Telephone Encounter (Signed)
Pt called and said that he came in for cpx and had labs done. Pt said that he was told that his results were normal. Pt went to get colonoscopy done and staff at the GI clinic told pt that he was dx with diabetes. Pt also said that he had tried to apply for certain type of insurance program and he was turned down to diabetes dx. Pt req call back from nurse or Dr [name] asap.

## 2011-10-11 NOTE — Telephone Encounter (Signed)
I spoke to him about the fact that he was diagnosed with type 2 DM on 12-06-08 when his fasting glucose was 133. Since then this has been controlled well with diet and exercise. He is frustrated by the fact that he was turned down by a [employer] term [employer] insurance company for this. I advised him that he could simply apply to another company, or else he could appeal this decision. If he does this, I would be happy to dictate a letter stating that his diabetes is well controlled off any medications and that this often helps change the company's decision. He said he plans to appeal.

## 2011-10-11 NOTE — Telephone Encounter (Signed)
Caller: Mitchell [name]/Patient; Phone: [phone]; Reason for Call: Patient request to speak with Dr.  Claris Che nurse regarding an error on his medical record.  States it has him down as diabetic and he states he is not.  Patient stated he tried to get long-term insurance and was denied because of his medical records stating he was diabetic and also about his glucose.  He is requesting a call back ASAP.  Thanks

## 2011-10-18 ENCOUNTER — Encounter: Payer: Self-pay | Admitting: Family Medicine

## 2011-10-18 NOTE — Telephone Encounter (Signed)
After reviewing his chart, I see he has had normal A1c readings consistently since 2008 ranging from 5.7 to 5.9. He has had elevated fasting glucoses during this time frame, with one reading of 133 on 11-29-08. We had used this single reading to give him a diagnosis of type 2 diabetes. However I now believe this to be in error, that in fact he has had hyperglycemia and has never had true diabetes. At the patient's request I will dictate a letter summarizing this to assist his efforts to acquire long term care insurance.

## 2011-10-18 NOTE — Telephone Encounter (Signed)
Caller: Mitchell [name]/Patient; Phone: [phone]; Reason for Call: Caller: Mitchell [name]/Patient; Patient Name: Mitchell [name]; PCP: [name] [facility]); Best Callback Phone Number: [phone]  PATIENT STATES HE SPOKE WITH DR.  [name] REGARDING A DETAILED LETTER ABOUT "BLOOD SUGAR SPIKES.  " STATES HIS INSURANCE AGENT IS REQUESTING A LETTER.  PATIENT IS REQUESTING THAT THE LETTER BE MAILED TO HIM (PATIENT) AT [address] [place], [place], [place] [address].  PATIENT CAN BE REACHED AT [phone].   Patient states he had a discussion with Dr.  [name] regarding above request.   RN reviewed prior note from Dr.  [name] in Epic Electronic Health Record: 10/11/2011 5: 17 PM Signed I spoke to him about the fact that he was diagnosed with type 2 DM on 12-06-08 when his fasting glucose was 133.  Since then this has been controlled well with diet and exercise.  He is frustrated by the fact that he was turned down by a [employer] term care insurance company for this.  I advised him that he could simply apply to another company, or else he could appeal this decision.  If he does this, I would be happy to dictate a letter stating that his diabetes is well controlled off any medications and that this often helps change the company's decision.  He said he plans to appeal.   Above message sent to Triage [facility] via Epic Electronic Health Record.

## 2011-10-24 ENCOUNTER — Telehealth: Payer: Self-pay | Admitting: Pulmonary Disease

## 2011-10-24 DIAGNOSIS — G4733 Obstructive sleep apnea (adult) (pediatric): Secondary | ICD-10-CM

## 2011-10-24 NOTE — Telephone Encounter (Signed)
I spoke with pt and he stated he needs a letter form [facility] stating he is not required to wear cpap. He is trying to get long care insurance and before they proceed with anything they need this letter from [facility]. He last saw [facility] in 2011 and he was intolerant to CPAP. Pt aware KC is out of the office this afternoon. Please advise thanks

## 2011-10-25 NOTE — Telephone Encounter (Signed)
Order in for pt to go to sleep ctr for mask fit pt wants to call and set up time Mitchell [name]

## 2011-10-25 NOTE — Telephone Encounter (Signed)
Pt is aware that he has severe slepp apnes and needs to wear cpap but was unable to tolerate or adjust to it. He wants to know if there are any other alternatives and feels he was given no other choices. KC, pls advise.

## 2011-10-25 NOTE — Telephone Encounter (Signed)
Ok to send order for nasal mask or nasal pillow fitting, and may need a chin strap to help keep mouth closed. Other option is to send to sleep center for mask fitting (code [id number]). Thanks.

## 2011-10-25 NOTE — Telephone Encounter (Signed)
There are no other GOOD options for severe sleep apnea.  Can try dental appliance with a dentist, but it will only partially treat sleep apnea.  This may make him feel a little better, but will not make a difference to insurance.  Weight loss is the ultimate and best treatment for sleep apnea.  If he is interested in considering a dental appliance, I am happy to discuss with him further and make a referral to a dentist here in town who does a lot of these.

## 2011-10-25 NOTE — Telephone Encounter (Signed)
Pt aware of KC recs. He states he will try the cpap again. He is requesting an order be sent to [facility] for a mask fitting. He states he can;t stand a full face mask. Please advise if okay KC thanks

## 2011-10-25 NOTE — Telephone Encounter (Signed)
Pt stated he would prefer to go over to the sleep center for this. I have placed order and is aware he will be called for an appt to have this done. Nothing further was needed. Please advise PCC's thanks

## 2011-10-25 NOTE — Telephone Encounter (Signed)
Make sure pt understands that he has SEVERE sleep apnea, and that the proper medical treatment is cpap.  I cannot write a letter saying he doesn't need to wear cpap, because he does.  He was intolerant of cpap, and that's all I can say in the letter.

## 2011-10-28 ENCOUNTER — Ambulatory Visit (HOSPITAL_BASED_OUTPATIENT_CLINIC_OR_DEPARTMENT_OTHER): Payer: BC Managed Care – PPO | Attending: Pulmonary Disease

## 2011-10-28 DIAGNOSIS — G4733 Obstructive sleep apnea (adult) (pediatric): Secondary | ICD-10-CM

## 2011-10-31 ENCOUNTER — Ambulatory Visit (INDEPENDENT_AMBULATORY_CARE_PROVIDER_SITE_OTHER): Payer: BC Managed Care – PPO | Admitting: Family Medicine

## 2011-10-31 DIAGNOSIS — E539 Vitamin B deficiency, unspecified: Secondary | ICD-10-CM

## 2011-10-31 MED ORDER — CYANOCOBALAMIN 1000 MCG/ML IJ SOLN
1000.0000 ug | Freq: Once | INTRAMUSCULAR | Status: AC
  Administered 2011-10-31: 1000 ug via INTRAMUSCULAR

## 2011-12-03 ENCOUNTER — Ambulatory Visit (INDEPENDENT_AMBULATORY_CARE_PROVIDER_SITE_OTHER): Payer: BC Managed Care – PPO | Admitting: Family Medicine

## 2011-12-03 DIAGNOSIS — E539 Vitamin B deficiency, unspecified: Secondary | ICD-10-CM

## 2011-12-03 MED ORDER — CYANOCOBALAMIN 1000 MCG/ML IJ SOLN
1000.0000 ug | Freq: Once | INTRAMUSCULAR | Status: AC
  Administered 2011-12-03: 1000 ug via INTRAMUSCULAR

## 2012-01-01 ENCOUNTER — Telehealth: Payer: Self-pay | Admitting: Cardiology

## 2012-01-01 NOTE — Telephone Encounter (Signed)
New Problem: ° ° ° °I called the patient and was unable to reach them. I left a message on their voicemail with my name, the reason I called, the name of their physician, and a number to call back to schedule their appointment. ° °

## 2012-01-02 ENCOUNTER — Ambulatory Visit (INDEPENDENT_AMBULATORY_CARE_PROVIDER_SITE_OTHER): Payer: BC Managed Care – PPO | Admitting: Family Medicine

## 2012-01-02 DIAGNOSIS — E539 Vitamin B deficiency, unspecified: Secondary | ICD-10-CM

## 2012-01-02 MED ORDER — CYANOCOBALAMIN 1000 MCG/ML IJ SOLN
1000.0000 ug | Freq: Once | INTRAMUSCULAR | Status: AC
  Administered 2012-01-02: 1000 ug via INTRAMUSCULAR

## 2012-02-06 ENCOUNTER — Ambulatory Visit (INDEPENDENT_AMBULATORY_CARE_PROVIDER_SITE_OTHER): Payer: BC Managed Care – PPO | Admitting: Family Medicine

## 2012-02-06 DIAGNOSIS — E538 Deficiency of other specified B group vitamins: Secondary | ICD-10-CM

## 2012-02-06 MED ORDER — CYANOCOBALAMIN 1000 MCG/ML IJ SOLN
1000.0000 ug | Freq: Once | INTRAMUSCULAR | Status: AC
  Administered 2012-02-06: 1000 ug via INTRAMUSCULAR

## 2012-02-18 ENCOUNTER — Telehealth: Payer: Self-pay | Admitting: Pulmonary Disease

## 2012-02-18 NOTE — Telephone Encounter (Signed)
Spoke with patient to try and inform him of KC's recs. Patient still requesting a copy of order from 2011 for his CPAP machine. I asked patient what he needed this old rx for and patient stated he was going to retiring soon and want to purchase a CPAP off the internet using this Rx for cheaper.  I asked patient if he would not need a more up to date Rx and he stated no he would not that the one from 2011 would be sufficient for this purchase.I have tried to explain to the patient that this is not allowed and Dr. Shelle Iron would not authorize this.  Have suggested patient to come on in to be seen and get a new RX.  Patient stated he did not want to wast his time coming in to talk for 15 min and pay "300 some dollars."  Patient a little feels like he has a right to his records.  Lawson Fiscal has also spoken with patient behind me and he informed her that he just wanted order printed out for his "records,"   Lawson Fiscal spoke with Medical Records and  per Lawson Fiscal-- Bronson Ing says she has never known for this (copies of orders) to be printed out for patients.  Will discuss w Philipp Deputy prior to contacting patient again.

## 2012-02-18 NOTE — Telephone Encounter (Signed)
ATC patient, [email or web address]

## 2012-02-18 NOTE — Telephone Encounter (Signed)
I spoke with our manager, Mitchell [name], and we cannot give a pt an old order so they can try to obtain a new cpap or supplies elsewhere. The pt has not been seen since 2011 and will need to come in for an appt before Mitchell [name] will send any orders for this. Mitchell [name] in medical records states that the pt did sign a release and specifically asked for the"prescription" that Mitchell [name] wrote in 2011 for the cpap. Medical records can not print prescriptions/orders and the pt was informed of this. He can get any office notes or test results that he may need.  I spoke with the pt again to let him know that we cannot reprint the old order,  it is no longer valid because it is from 2011.  The pt says he will contact [facility] to see if they have a copy of the order on file  Again, I told the pt he will not be able to use the old order for a new cpap because Mitchell [name] is not writing for a new cpap without an office visit. The pt was directed to call Mitchell [name], manager for medical records, to discuss any record issues.   We offered to make the pt an appt for follow-up and he refused to do so. He said he should not have to keep making appts to get a new machine or supplies. Pt advised it is not good patient care to write new orders without a yearly office visit to discuss any changes or problems he may be havng related to his sleep. The pt still refused an appt.

## 2012-02-18 NOTE — Telephone Encounter (Signed)
Pt returned call. Mitchell [name]  

## 2012-02-18 NOTE — Telephone Encounter (Signed)
Let him know that I cannot prescribe anything in the state of [place] if I have not seen the pt in a year.  This is from the [facility] board of medicine.

## 2012-02-18 NOTE — Telephone Encounter (Signed)
I spoke with pt and he stated he wants to buy a CPp for when he is traveling. He was requesting an "old copy of his CPAP RX". I advised pt that would not work bc it requires new RX. Pt has not been seen since 2011. Pt did not fell like it was necessary to make an appt for "15 minutes" and his insurance bill him "$400" for this. Pt is requesting a new RX for a CPAP to buy himself. Please advise KC thanks

## 2012-02-21 ENCOUNTER — Ambulatory Visit: Payer: BC Managed Care – PPO | Admitting: Cardiology

## 2012-03-05 ENCOUNTER — Ambulatory Visit: Payer: BC Managed Care – PPO | Admitting: Family Medicine

## 2012-03-13 ENCOUNTER — Encounter: Payer: Self-pay | Admitting: Cardiology

## 2012-03-13 ENCOUNTER — Ambulatory Visit (INDEPENDENT_AMBULATORY_CARE_PROVIDER_SITE_OTHER): Payer: BC Managed Care – PPO | Admitting: Cardiology

## 2012-03-13 VITALS — BP 118/80 | HR 88 | Wt 200.0 lb

## 2012-03-13 DIAGNOSIS — I25810 Atherosclerosis of coronary artery bypass graft(s) without angina pectoris: Secondary | ICD-10-CM

## 2012-03-13 DIAGNOSIS — I2510 Atherosclerotic heart disease of native coronary artery without angina pectoris: Secondary | ICD-10-CM

## 2012-03-13 DIAGNOSIS — I10 Essential (primary) hypertension: Secondary | ICD-10-CM

## 2012-03-13 NOTE — Assessment & Plan Note (Signed)
Blood pressure controlled. Continue present medications. Potassium and renal function monitored by primary care. 

## 2012-03-13 NOTE — Patient Instructions (Addendum)
Your physician wants you to follow-up in: 12 months with Dr Shelda Pal will receive a reminder letter in the mail two months in advance. If you don't receive a letter, please call our office to schedule the follow-up appointment.

## 2012-03-13 NOTE — Assessment & Plan Note (Signed)
Continue statin. Lipids and liver monitored by primary care. 

## 2012-03-13 NOTE — Progress Notes (Signed)
HPI: [name] is a gentleman who has a history of coronary artery disease. In September 2001, he underwent cardiac catheterization in the setting of a myocardial infarction. He was found to have moderate coronary disease in the left system. There was 100% right coronary artery. His ejection fraction was 50%. He had PTCA/stenting of the right coronary artery at that time. Last nuclear study in October of 2012 showed an ejection fraction of 55%. There was an inferior and inferolateral infarct with minimal peri-infarct ischemia. We have treated medically. He also had an abdominal ultrasound on July 16, 2007 that showed no aneurysm. I last saw him in Sept 2012. Since then he has mild dyspnea on exertion but there is no orthopnea, PND, pedal edema, palpitations or syncope. He's had no chest pain.   Current Outpatient Prescriptions  Medication Sig Dispense Refill  . aspirin 81 MG tablet Take 81 mg by mouth daily.      Marland Kitchen buPROPion (WELLBUTRIN SR) 150 MG 12 hr tablet       . CIALIS 20 MG tablet TAKE 1 TABLET (20 MG TOTAL) BY MOUTH DAILY AS NEEDED.  10 tablet  11  . Coenzyme Q10 (CO Q 10 PO) Take by mouth daily.      Marland Kitchen lisinopril-hydrochlorothiazide (PRINZIDE,ZESTORETIC) 20-25 MG per tablet Take 1 tablet by mouth daily.  30 tablet  11  . naproxen sodium (ANAPROX) 220 MG tablet Take 220 mg by mouth. As needed       . Omega-3 Fatty Acids ([employer] FISH OIL) 1000 MG CAPS Take 1 capsule by mouth daily.  1 capsule  0  . rosuvastatin (CRESTOR) 20 MG tablet Take 1 tablet (20 mg total) by mouth daily.  30 tablet  11   No current facility-administered medications for this visit.     Past Medical History  Diagnosis Date  . Sleep apnea     Dr Shelle Iron  . Hyperlipemia   . Hypertension   . Myocardial infarction     Dr Jens Som 2001  . CAD (coronary artery disease)     sees Dr. Olga Millers   . Vitamin B 12 deficiency   . Depression   . ED (erectile dysfunction)   . Gout     Past Surgical History    Procedure Laterality Date  . Tonsillectomy    . Angioplasty  2001    stent placement  . Vasectomy reversal    . Knee surgery    . Strabimus      repair  . Cervical laminectomy    . Colonoscopy  07-06-08    per Dr. Jarold Motto, repeat in 3 yrs    History   Social History  . Marital Status: Married    Spouse Name: N/A    Number of Children: N/A  . Years of Education: N/A   Occupational History  . Not on file.   Social History Main Topics  . Smoking status: Former Smoker -- 1.00 packs/day for 49 years    Types: Cigarettes    Quit date: 01/15/2008  . Smokeless tobacco: Never Used  . Alcohol Use: 1.0 oz/week    2 drink(s) per week  . Drug Use: No  . Sexually Active: Not on file   Other Topics Concern  . Not on file   Social History Narrative  . No narrative on file    ROS: no fevers or chills, productive cough, hemoptysis, dysphasia, odynophagia, melena, hematochezia, dysuria, hematuria, rash, seizure activity, orthopnea, PND, pedal edema, claudication. Remaining systems are  negative.  Physical Exam: Well-developed well-nourished in no acute distress.  Skin is warm and dry.  HEENT is normal.  Neck is supple.  Chest is clear to auscultation with normal expansion.  Cardiovascular exam is regular rate and rhythm.  Abdominal exam nontender or distended. No masses palpated. Extremities show no edema. neuro grossly intact  ECG sinus rhythm at a rate of 88. Prior inferior infarct.

## 2012-03-13 NOTE — Assessment & Plan Note (Signed)
Continue aspirin and statin. Continue risk factor modification. 

## 2012-05-29 ENCOUNTER — Other Ambulatory Visit: Payer: Self-pay | Admitting: Family Medicine

## 2012-05-30 ENCOUNTER — Other Ambulatory Visit: Payer: Self-pay | Admitting: Family Medicine

## 2012-07-03 ENCOUNTER — Other Ambulatory Visit: Payer: Self-pay | Admitting: Family Medicine

## 2012-07-03 NOTE — Telephone Encounter (Signed)
Can we refill this? 

## 2012-09-27 ENCOUNTER — Other Ambulatory Visit: Payer: Self-pay | Admitting: Family Medicine

## 2012-09-28 NOTE — Telephone Encounter (Signed)
Can we refill these? 

## 2012-11-20 ENCOUNTER — Other Ambulatory Visit: Payer: Self-pay | Admitting: Family Medicine

## 2012-11-30 ENCOUNTER — Other Ambulatory Visit (INDEPENDENT_AMBULATORY_CARE_PROVIDER_SITE_OTHER): Payer: BC Managed Care – PPO

## 2012-11-30 DIAGNOSIS — Z0000 Encounter for general adult medical examination without abnormal findings: Secondary | ICD-10-CM

## 2012-11-30 LAB — CBC WITH DIFFERENTIAL/PLATELET
Basophils Relative: 0.4 % (ref 0.0–3.0)
Eosinophils Relative: 2.4 % (ref 0.0–5.0)
HCT: 38.9 % — ABNORMAL LOW (ref 39.0–52.0)
Lymphs Abs: 1.5 10*3/uL (ref 0.7–4.0)
MCV: 93.4 fl (ref 78.0–100.0)
Monocytes Absolute: 0.7 10*3/uL (ref 0.1–1.0)
RBC: 4.17 Mil/uL — ABNORMAL LOW (ref 4.22–5.81)
WBC: 9.1 10*3/uL (ref 4.5–10.5)

## 2012-11-30 LAB — LIPID PANEL
LDL Cholesterol: 54 mg/dL (ref 0–99)
Total CHOL/HDL Ratio: 4
Triglycerides: 181 mg/dL — ABNORMAL HIGH (ref 0.0–149.0)
VLDL: 36.2 mg/dL (ref 0.0–40.0)

## 2012-11-30 LAB — POCT URINALYSIS DIPSTICK
Blood, UA: NEGATIVE
Glucose, UA: NEGATIVE
Nitrite, UA: NEGATIVE
Protein, UA: NEGATIVE
Urobilinogen, UA: 1

## 2012-11-30 LAB — BASIC METABOLIC PANEL
Chloride: 105 mEq/L (ref 96–112)
Potassium: 4.9 mEq/L (ref 3.5–5.1)
Sodium: 139 mEq/L (ref 135–145)

## 2012-11-30 LAB — HEPATIC FUNCTION PANEL
ALT: 19 U/L (ref 0–53)
Alkaline Phosphatase: 53 U/L (ref 39–117)
Total Bilirubin: 0.8 mg/dL (ref 0.3–1.2)
Total Protein: 7.3 g/dL (ref 6.0–8.3)

## 2012-11-30 LAB — TSH: TSH: 1.54 u[IU]/mL (ref 0.35–5.50)

## 2012-12-07 ENCOUNTER — Ambulatory Visit (INDEPENDENT_AMBULATORY_CARE_PROVIDER_SITE_OTHER): Payer: BC Managed Care – PPO | Admitting: Family Medicine

## 2012-12-07 ENCOUNTER — Encounter: Payer: Self-pay | Admitting: Family Medicine

## 2012-12-07 VITALS — BP 130/68 | HR 101 | Temp 98.2°F | Ht 65.0 in | Wt 200.0 lb

## 2012-12-07 DIAGNOSIS — Z0000 Encounter for general adult medical examination without abnormal findings: Secondary | ICD-10-CM

## 2012-12-07 NOTE — Progress Notes (Signed)
  Subjective:    Patient ID: Mitchell [name], male    DOB: 1945-09-27, 67 y.o.   MRN: [id number]  HPI 67 yr old male for a cpx. He feels well. He plans to retire on March 1, so he will have more time to exercise and eat a healthy diet.    Review of Systems  Constitutional: Negative.   HENT: Negative.   Eyes: Negative.   Respiratory: Negative.   Cardiovascular: Negative.   Gastrointestinal: Negative.   Genitourinary: Negative.   Musculoskeletal: Negative.   Skin: Negative.   Neurological: Negative.   Psychiatric/Behavioral: Negative.        Objective:   Physical Exam  Constitutional: He is oriented to person, place, and time. He appears well-developed and well-nourished. No distress.  HENT:  Head: Normocephalic and atraumatic.  Right Ear: External ear normal.  Left Ear: External ear normal.  Nose: Nose normal.  Mouth/Throat: Oropharynx is clear and moist. No oropharyngeal exudate.  Eyes: Conjunctivae and EOM are normal. Pupils are equal, round, and reactive to light. Right eye exhibits no discharge. Left eye exhibits no discharge. No scleral icterus.  Neck: Neck supple. No JVD present. No tracheal deviation present. No thyromegaly present.  Cardiovascular: Normal rate, regular rhythm, normal heart sounds and intact distal pulses.  Exam reveals no gallop and no friction rub.   No murmur heard. Pulmonary/Chest: Effort normal and breath sounds normal. No respiratory distress. He has no wheezes. He has no rales. He exhibits no tenderness.  Abdominal: Soft. Bowel sounds are normal. He exhibits no distension and no mass. There is no tenderness. There is no rebound and no guarding.  Genitourinary: Rectum normal, prostate normal and penis normal. Guaiac negative stool. No penile tenderness.  Musculoskeletal: Normal range of motion. He exhibits no edema and no tenderness.  Lymphadenopathy:    He has no cervical adenopathy.  Neurological: He is alert and oriented to person, place, and time.  He has normal reflexes. No cranial nerve deficit. He exhibits normal muscle tone. Coordination normal.  Skin: Skin is warm and dry. No rash noted. He is not diaphoretic. No erythema. No pallor.  Psychiatric: He has a normal mood and affect. His behavior is normal. Judgment and thought content normal.          Assessment & Plan:  Well exam. He needs to lose weight.

## 2012-12-07 NOTE — Progress Notes (Signed)
Pre visit review using our clinic review tool, if applicable. No additional management support is needed unless otherwise documented below in the visit note. 

## 2013-01-31 ENCOUNTER — Other Ambulatory Visit: Payer: Self-pay | Admitting: Family Medicine

## 2013-03-02 ENCOUNTER — Ambulatory Visit: Payer: BC Managed Care – PPO | Admitting: Cardiology

## 2013-04-07 ENCOUNTER — Telehealth: Payer: Self-pay | Admitting: Family Medicine

## 2013-04-07 NOTE — Telephone Encounter (Signed)
Pt is requesting to speak with the nurse regarding changing his medication.

## 2013-04-07 NOTE — Telephone Encounter (Signed)
I spoke with pt and he said that the Cialis is not working at all. Can you recommend something else?

## 2013-04-08 NOTE — Telephone Encounter (Signed)
Try Viagra 100 mg prn. Call in #10 with 11 rf

## 2013-04-09 MED ORDER — SILDENAFIL CITRATE 100 MG PO TABS: 100.0000 mg | ORAL_TABLET | ORAL | Status: DC | PRN

## 2013-04-09 NOTE — Telephone Encounter (Signed)
Rx sent to pharmacy and pt is aware. 

## 2013-04-26 ENCOUNTER — Ambulatory Visit (INDEPENDENT_AMBULATORY_CARE_PROVIDER_SITE_OTHER): Payer: Medicare Other | Admitting: Cardiology

## 2013-04-26 ENCOUNTER — Encounter: Payer: Self-pay | Admitting: Cardiology

## 2013-04-26 VITALS — BP 128/70 | HR 60 | Ht 65.0 in | Wt 197.0 lb

## 2013-04-26 DIAGNOSIS — I10 Essential (primary) hypertension: Secondary | ICD-10-CM

## 2013-04-26 DIAGNOSIS — I2510 Atherosclerotic heart disease of native coronary artery without angina pectoris: Secondary | ICD-10-CM

## 2013-04-26 NOTE — Patient Instructions (Signed)
Your physician wants you to follow-up in: ONE YEAR WITH DR CRENSHAW You will receive a reminder letter in the mail two months in advance. If you don't receive a letter, please call our office to schedule the follow-up appointment.  

## 2013-04-26 NOTE — Progress Notes (Signed)
HPI: FU coronary artery disease. In September 2001, he underwent cardiac catheterization in the setting of a myocardial infarction. He was found to have moderate coronary disease in the left system. There was 100% right coronary artery. His ejection fraction was 50%. He had PTCA/stenting of the right coronary artery at that time. Last nuclear study in October of 2012 showed an ejection fraction of 55%. There was an inferior and inferolateral infarct with minimal peri-infarct ischemia. We have treated medically. He also had an abdominal ultrasound on July 16, 2007 that showed no aneurysm. I last saw him in Feb 2014. Since then he has mild dyspnea on exertion but there is no orthopnea, PND, pedal edema, palpitations or syncope. He's had no chest pain.   Current Outpatient Prescriptions  Medication Sig Dispense Refill  . aspirin 81 MG tablet Take 81 mg by mouth daily.      . Coenzyme Q10 (CO Q 10 PO) Take by mouth daily.      . CRESTOR 20 MG tablet TAKE 1 TABLET (20 MG TOTAL) BY MOUTH DAILY.  30 tablet  11  . indomethacin (INDOCIN) 50 MG capsule TAKE 1 CAPSULE (50 MG TOTAL) BY MOUTH 3 (THREE) TIMES DAILY WITH MEALS.  60 capsule  10  . lisinopril-hydrochlorothiazide (PRINZIDE,ZESTORETIC) 20-25 MG per tablet TAKE 1 TABLET BY MOUTH DAILY.  90 tablet  10  . naproxen sodium (ANAPROX) 220 MG tablet Take 220 mg by mouth. As needed       . Omega-3 Fatty Acids ([employer] FISH OIL) 1000 MG CAPS Take 1 capsule by mouth daily.  1 capsule  0  . sildenafil (VIAGRA) 100 MG tablet Take 1 tablet (100 mg total) by mouth as needed for erectile dysfunction.  10 tablet  11   No current facility-administered medications for this visit.     Past Medical History  Diagnosis Date  . Sleep apnea     Dr Gwenette Greet  . Hyperlipemia   . Hypertension   . Myocardial infarction     Dr Stanford Breed 2001  . CAD (coronary artery disease)     sees Dr. Kirk Ruths   . Vitamin B 12 deficiency   . Depression   . ED (erectile  dysfunction)   . Gout     Past Surgical History  Procedure Laterality Date  . Tonsillectomy    . Angioplasty  2001    stent placement  . Vasectomy reversal    . Knee surgery    . Strabimus      repair  . Cervical laminectomy    . Colonoscopy  08-23-11    per Dr. Sharlett Iles, repeat in 5 yrs    History   Social History  . Marital Status: Married    Spouse Name: N/A    Number of Children: N/A  . Years of Education: N/A   Occupational History  . Not on file.   Social History Main Topics  . Smoking status: Former Smoker -- 1.00 packs/day for 49 years    Types: Cigarettes    Quit date: 01/15/2008  . Smokeless tobacco: Never Used  . Alcohol Use: 2.5 oz/week    5 drink(s) per week  . Drug Use: No  . Sexual Activity: Not on file   Other Topics Concern  . Not on file   Social History Narrative  . No narrative on file    ROS: no fevers or chills, productive cough, hemoptysis, dysphasia, odynophagia, melena, hematochezia, dysuria, hematuria, rash, seizure activity, orthopnea, PND,  pedal edema, claudication. Remaining systems are negative.  Physical Exam: Well-developed well-nourished in no acute distress.  Skin is warm and dry.  HEENT is normal.  Neck is supple.  Chest is clear to auscultation with normal expansion.  Cardiovascular exam is regular rate and rhythm.  Abdominal exam nontender or distended. No masses palpated. Extremities show no edema. neuro grossly intact  ECG Sinus rhythm at a rate of 60. Prior inferior infarct.

## 2013-04-26 NOTE — Assessment & Plan Note (Signed)
Continue statin. 

## 2013-04-26 NOTE — Assessment & Plan Note (Signed)
Continue aspirin and statin. He is not having symptoms. He is exercising routinely and following a diet.

## 2013-04-26 NOTE — Assessment & Plan Note (Signed)
Blood pressure controlled. Continue present medications. Potassium and renal function monitored by primary care. 

## 2013-06-25 ENCOUNTER — Telehealth: Payer: Self-pay | Admitting: Family Medicine

## 2013-06-25 DIAGNOSIS — R200 Anesthesia of skin: Secondary | ICD-10-CM

## 2013-06-25 DIAGNOSIS — R202 Paresthesia of skin: Principal | ICD-10-CM

## 2013-06-25 NOTE — Telephone Encounter (Signed)
Pt is requesting a referral to a neurologist, pt has bcbs/medicare.

## 2013-06-25 NOTE — Telephone Encounter (Signed)
I spoke with pt and he said that during the night he wakes up and both hands are asleep, also his left leg has been tingling.

## 2013-06-25 NOTE — Telephone Encounter (Signed)
What is the referral for? 

## 2013-06-25 NOTE — Telephone Encounter (Signed)
Referral was done  

## 2013-06-25 NOTE — Telephone Encounter (Signed)
I spoke with pt  

## 2013-06-30 ENCOUNTER — Encounter: Payer: Self-pay | Admitting: Neurology

## 2013-06-30 ENCOUNTER — Ambulatory Visit (INDEPENDENT_AMBULATORY_CARE_PROVIDER_SITE_OTHER): Payer: Medicare Other | Admitting: Neurology

## 2013-06-30 VITALS — BP 130/70 | HR 62 | Ht 65.75 in | Wt 195.5 lb

## 2013-06-30 DIAGNOSIS — G5710 Meralgia paresthetica, unspecified lower limb: Secondary | ICD-10-CM

## 2013-06-30 DIAGNOSIS — G5712 Meralgia paresthetica, left lower limb: Secondary | ICD-10-CM

## 2013-06-30 DIAGNOSIS — I2510 Atherosclerotic heart disease of native coronary artery without angina pectoris: Secondary | ICD-10-CM

## 2013-06-30 DIAGNOSIS — R209 Unspecified disturbances of skin sensation: Secondary | ICD-10-CM

## 2013-06-30 DIAGNOSIS — M5412 Radiculopathy, cervical region: Secondary | ICD-10-CM

## 2013-06-30 MED ORDER — GABAPENTIN 300 MG PO CAPS: 300.0000 mg | ORAL_CAPSULE | Freq: Every day | ORAL | Status: DC

## 2013-06-30 NOTE — Patient Instructions (Signed)
1.  Start neurontin 300mg  at bedtime 2.  EMG of the arms 3.  Call the office in 2 weeks  4.  Strongly encouraged to reduce alcohol intake 5.  Return to clinic in 91-months

## 2013-06-30 NOTE — Progress Notes (Signed)
[facility] Clinic Note - Initial Visit   Date: 06/30/2013  Mitchell [name] MRN: [id number] DOB: [birth year]   Dear Dr Sarajane Jews:  Thank you for your kind referral of Mitchell [name] for consultation of bilateral hand paresthesias. Although his history is well known to you, please allow [name] to reiterate it for the purpose of our medical record. The patient was accompanied to the clinic by self.     History of Present Illness: Mitchell [name] is a 68 y.o. right-handed Caucasian male with history of OSA , CAD s/p PCI (2001), hyperlipidemia, and hypertension presenting for evaluation of bilateral arm pain and tingling.    Starting around 2010, he noticed tingling of the hands, worse on the right hand.  Symptoms were initially intermittent, but recently become more constant.  It is worse at nighttime, especially when holding a phone > 30 seconds, holding the steering wheel, or even using a fork to eat.  At nighttime, he has tingling and pain that starts in his neck and radiates all the way into his hands.  There is no sensory change over the dorsum of the hand, only the palmer surface.  He does not notice pain at rest.  He endorses neck pain with rotation and underwent cervical laminectomy in 1999.  He has tried chiropractic adjustments without significant improvement.  He has noticed that opening jars has become more effortful.  Over the past few months, he noticed numbness over the lateral thigh and started having same numbness on the right side. He has lost about 7lb and denies wearing any constrictive clothing other than a belt.   Out-side paper records, electronic medical record, and images have been reviewed where available and summarized as:  Labs 12/01/2012: TSH 1.54  Labs 07/02/2011:  vitamin B12 477, HbA1c 5.9  CT head wo contrast 10/25/2009 1. Chronic ethmoid sinusitis. Chronic right frontal sinusitis.  2. Mild left mastoid effusion.  3. Several small lacunar infarcts are  likely remote.  4. Chronic microvascular white matter disease.  5. No discrete acute intracranial findings are identified.   Past Medical History  Diagnosis Date  . Sleep apnea     Dr Gwenette Greet  . Hyperlipemia   . Hypertension   . Myocardial infarction     Dr Stanford Breed 2001  . CAD (coronary artery disease)     sees Dr. Kirk Ruths   . Vitamin B 12 deficiency   . Depression   . ED (erectile dysfunction)   . Gout     Past Surgical History  Procedure Laterality Date  . Tonsillectomy    . Angioplasty  2001    stent placement  . Vasectomy reversal    . Knee surgery    . Strabimus      repair  . Cervical laminectomy    . Colonoscopy  08-23-11    per Dr. Sharlett Iles, repeat in 5 yrs     Medications:  Current Outpatient Prescriptions on File Prior to Visit  Medication Sig Dispense Refill  . aspirin 81 MG tablet Take 81 mg by mouth daily.      . CRESTOR 20 MG tablet TAKE 1 TABLET (20 MG TOTAL) BY MOUTH DAILY.  30 tablet  11  . indomethacin (INDOCIN) 50 MG capsule TAKE 1 CAPSULE (50 MG TOTAL) BY MOUTH 3 (THREE) TIMES DAILY WITH MEALS.  60 capsule  10  . lisinopril-hydrochlorothiazide (PRINZIDE,ZESTORETIC) 20-25 MG per tablet TAKE 1 TABLET BY MOUTH DAILY.  90 tablet  10  . Omega-3 Fatty Acids ([employer]  FISH OIL) 1000 MG CAPS Take 1 capsule by mouth daily.  1 capsule  0  . sildenafil (VIAGRA) 100 MG tablet Take 1 tablet (100 mg total) by mouth as needed for erectile dysfunction.  10 tablet  11   No current facility-administered medications on file prior to visit.    Allergies:  Allergies  Allergen Reactions  . Atorvastatin     REACTION: itching    Family History: Family History  Problem Relation Age of Onset  . Emphysema Mother   . Heart disease Father   . Diabetes Mother   . Peripheral vascular disease Father   . Hypertension Father   . COPD Mother   . Colon cancer Neg Hx   . Stomach cancer Neg Hx   . Diabetes Brother   . Healthy Daughter   . Healthy Son     Social  History: History   Social History  . Marital Status: Married    Spouse Name: N/A    Number of Children: N/A  . Years of Education: N/A   Occupational History  . Not on file.   Social History Main Topics  . Smoking status: Former Smoker -- 1.00 packs/day for 49 years    Types: Cigarettes    Quit date: 01/15/2008  . Smokeless tobacco: Never Used  . Alcohol Use: 2.5 oz/week    5 drink(s) per week     Comment: 6 beers per night x 6 years  . Drug Use: No  . Sexual Activity: Not on file   Other Topics Concern  . Not on file   Social History Narrative   He recently retired from education (classroom, principal, and Contractor).   He lives with wife.  They have three grown children, daughter is in dermatology residency at [employer].   Highest level of education:  masters    Review of Systems:  CONSTITUTIONAL: No fevers, chills, night sweats, + 7lb weight loss.   EYES: No visual changes or eye pain ENT: No hearing changes.  No history of nose bleeds.   RESPIRATORY: No cough, wheezing and shortness of breath.   CARDIOVASCULAR: Negative for chest pain, and palpitations.   GI: Negative for abdominal discomfort, blood in stools or black stools.  No recent change in bowel habits.   GU:  No history of incontinence.   MUSCLOSKELETAL: +history of joint pain or swelling.  No myalgias.   SKIN: Negative for lesions, rash, and itching.   HEMATOLOGY/ONCOLOGY: Negative for prolonged bleeding, bruising easily, and swollen nodes. ENDOCRINE: Negative for cold or heat intolerance, polydipsia or goiter.   PSYCH:  No depression or anxiety symptoms.   NEURO: As Above.   Vital Signs:  BP 130/70  Pulse 62  Ht 5' 5.75" (1.67 m)  Wt 195 lb 8 oz (88.678 kg)  BMI 31.80 kg/m2  SpO2 95%   General Medical Exam:   General:  Well appearing, comfortable.   Eyes/ENT: see cranial nerve examination.   Neck: No masses appreciated.  Full range of motion without tenderness.  No carotid  bruits. Respiratory:  Clear to auscultation, good air entry bilaterally.   Cardiac:  Regular rate and rhythm, no murmur.   Back:  No pain to palpation of spinous processes.   Extremities:  No deformities, edema, or skin discoloration. Good capillary refill.   Skin:  Skin color, texture, turgor normal. No rashes or lesions.  Neurological Exam: MENTAL STATUS including orientation to time, place, person, recent and remote memory, attention span and concentration, language, and  fund of knowledge is normal.  Speech is not dysarthric.  CRANIAL NERVES: II:  No visual field defects.  Unremarkable fundi.   III-IV-VI: Pupils equal round and reactive to light.  Normal conjugate, extra-ocular eye movements in all directions of gaze.  No nystagmus.  No ptosis.   V:  Normal facial sensation.   VII:  Normal facial symmetry and movements.   VIII:  Normal hearing and vestibular function.   IX-X:  Normal palatal movement.   XI:  Normal shoulder shrug and head rotation.   XII:  Normal tongue strength and range of motion, no deviation or fasciculation.  MOTOR:  No atrophy, fasciculations or abnormal movements.  No pronator drift.  Tone is normal.    Right Upper Extremity:    Left Upper Extremity:    Deltoid  5/5   Deltoid  5/5   Biceps  5/5   Biceps  5/5   Triceps  5/5   Triceps  5/5   Wrist extensors  5/5   Wrist extensors  5/5   Wrist flexors  5/5   Wrist flexors  5/5   Finger extensors  5/5   Finger extensors  5/5   Finger flexors  5/5   Finger flexors  5/5   Dorsal interossei  5/5   Dorsal interossei  5/5   Abductor pollicis  5/5   Abductor pollicis  5/5   Tone (Ashworth scale)  0  Tone (Ashworth scale)  0   Right Lower Extremity:    Left Lower Extremity:    Hip flexors  5/5   Hip flexors  5/5   Hip extensors  5/5   Hip extensors  5/5   Knee flexors  5/5   Knee flexors  5/5   Knee extensors  5/5   Knee extensors  5/5   Dorsiflexors  5/5   Dorsiflexors  5/5   Plantarflexors  5/5    Plantarflexors  5/5   Toe extensors  5/5   Toe extensors  5/5   Toe flexors  5/5   Toe flexors  5/5   Tone (Ashworth scale)  0  Tone (Ashworth scale)  0   MSRs:  Right                                                                 Left brachioradialis 2+  brachioradialis 2+  biceps 2+  biceps 2+  triceps 2+  triceps 2+  patellar 2+  patellar 2+  ankle jerk 2+  ankle jerk 2+  Hoffman no  Hoffman no  plantar response up  plantar response down    SENSORY:  Reduced pin prick of the left anterolateral aspect of the thigh.  Otherwise, normal and symmetric perception of light touch, pinprick, vibration, and proprioception.  Romberg's sign absent.   COORDINATION/GAIT: Normal finger-to- nose-finger and heel-to-shin.  Intact rapid alternating movements bilaterally.  Able to rise from a chair without using arms.  Gait narrow based and stable. Unsteady with tandem gait, but able to persom.     IMPRESSION: Mr. Dill is a 68 year-old man presenting for evaluation of dysesthesias of the upper extremities and numbness of the left thigh.  Exam notable for diminished sensation over the anterolateral thigh on the left side and extensor plantar response  on the right, which maybe from known history of cervical disc disease.  There are no other myelopathic findings.  Sensation and strength testing of the arms is normal.  Regarding his upper extremity symptoms, I will order EMG to help determine whether symptoms are due to ulnar neuropathy vs. cervical radiculopathy. For symptomatic relief, I will start him on neurontin 300mg  qhs which can be titrated as needed.  Risks and benefits discussed.  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 Management is conservative with NSAIDs and avoidance of repetitive trauma to this region. I have discussed the diagnosis, pathophysiology, management plan, and risks and  benefits of medications, and prognosis.   From general wellbeing standpoint, I have strongly encouraged him to reduce his alcohol consumption to prevent further injury to the nerves.  PLAN/RECOMMENDATIONS:  1. Start neurontin 300mg  at bedtime  2. EMG of R > L upper extremities  3. Avoid tight fitting clothing or belts  4. Weight loss encouraged 5. Strongly encouraged him to reduce alcohol intake 6. Return to clinic in 27-months   The duration of this appointment visit was 50 minutes of face-to-face time with the patient.  Greater than 50% of this time was spent in counseling, explanation of diagnosis, planning of further management, and coordination of care.   Thank you for allowing me to participate in patient's care.  If I can answer any additional questions, I would be pleased to do so.    Sincerely,    Donika K. Posey Pronto, DO

## 2013-07-12 ENCOUNTER — Telehealth: Payer: Self-pay | Admitting: Neurology

## 2013-07-12 NOTE — Telephone Encounter (Signed)
I spoke with patient and he said the Gabapentin is helping a little bit but he thinks it needs to be a little bit stronger.

## 2013-07-12 NOTE — Telephone Encounter (Signed)
calling with med update as requested. CB# [phone] / Venida Jarvis

## 2013-07-12 NOTE — Telephone Encounter (Signed)
Left message for patient to call me back. 

## 2013-07-12 NOTE — Telephone Encounter (Signed)
Please let him know that we can increase to 300mg  twice daily x 3 days.  If he is tolerating after 3 days, he can increase to 300mg  three times daily.  He can let us know if he needs new Rx sent.  Donika K. Posey Pronto, DO

## 2013-07-12 NOTE — Telephone Encounter (Signed)
Patient given instructions

## 2013-07-15 ENCOUNTER — Other Ambulatory Visit: Payer: Self-pay | Admitting: Family Medicine

## 2013-08-26 ENCOUNTER — Ambulatory Visit (INDEPENDENT_AMBULATORY_CARE_PROVIDER_SITE_OTHER): Payer: Medicare Other | Admitting: Neurology

## 2013-08-26 DIAGNOSIS — R209 Unspecified disturbances of skin sensation: Secondary | ICD-10-CM

## 2013-08-26 MED ORDER — GABAPENTIN 300 MG PO CAPS: 300.0000 mg | ORAL_CAPSULE | Freq: Two times a day (BID) | ORAL | Status: DC

## 2013-08-26 NOTE — Procedures (Signed)
[facility]  [address] [place], [place]  [place], [place] [address] Tel: [phone] Fax:  [phone] Test Date:  08/26/2013  Patient: Mitchell [name] DOB: 03-11-1945 Physician: Narda Amber  Sex: Male Height: 5\' 6"  Ref Phys: Narda Amber  ID#: [id number] Temp: 35.0C Technician: Laureen Ochs R. NCS T.   Patient Complaints: Patient is a 68 year old male here for evaluation of bilateral hand paresthesias, worse on the right.   NCV & EMG Findings: Extensive electrodiagnostic testing of the right upper extremity and additional studies of the left reveals:  1. Bilateral median sensory responses are absent. Bilateral ulnar and radial sensory responses are within normal limits. 2. Bilateral median motor responses are prolonged, the right median motor amplitude with borderline low. The right ulnar motor response is also borderline low and in the setting of normal needle electrode examination of ulnar innervated muscles, this finding is of unclear clinical significance. 3. Sparse chronic motor axon loss changes are restricted to the right abductor pollicis brevis muscle. There is no evidence of accompanied active denervation. Ulnar-innervated muscles are within normal limits.  Impression: 1. Bilateral median neuropathy at or distal to the wrist, consistent with the clinical diagnosis of carpal tunnel syndrome. Overall, these findings are moderate in degree electrically. 2. There is no evidence of a cervical motor radiculopathy affecting the upper extremities.    ___________________________ Narda Amber    Nerve Conduction Studies Anti Sensory Summary Table   Stim Site NR Peak (ms) Norm Peak (ms) P-T Amp (V) Norm P-T Amp  Left Median Anti Sensory (2nd Digit)  Wrist NR  <3.8  >10  Right Median Anti Sensory (2nd Digit)  Wrist NR  <3.8  >10  Left Radial Anti Sensory (Base 1st Digit)  Wrist    2.4 <2.8 25.6 >10  Right Radial Anti Sensory (Base 1st Digit)  Wrist    2.1 <2.8 31.3  >10  Left Ulnar Anti Sensory (5th Digit)  Wrist    2.8 <3.2 15.1 >5  Right Ulnar Anti Sensory (5th Digit)  Wrist    3.1 <3.2 15.5 >5   Motor Summary Table   Stim Site NR Onset (ms) Norm Onset (ms) O-P Amp (mV) Norm O-P Amp Site1 Site2 Delta-0 (ms) Dist (cm) Vel (m/s) Norm Vel (m/s)  Left Median Motor (Abd Poll Brev)  Wrist    6.8 <4.0 10.6 >5 Elbow Wrist 4.8 24.0 50 >50  Elbow    11.6  8.7         Right Median Motor (Abd Poll Brev)  Wrist    7.0 <4.0 7.7 >5 Elbow Wrist 5.0 27.0 54 >50  Elbow    12.0  7.2         Left Ulnar Motor (Abd Dig Minimi)  Wrist    2.5 <3.1 10.8 >7 B Elbow Wrist 4.1 22.5 55 >50  B Elbow    6.6  10.0  A Elbow B Elbow 1.8 10.0 56 >50  A Elbow    8.4  9.8         Right Ulnar Motor (Abd Dig Minimi)  Wrist    2.5 <3.1 6.7 >7 B Elbow Wrist 4.2 22.5 54 >50  B Elbow    6.7  5.9  A Elbow B Elbow 1.5 10.0 67 >50  A Elbow    8.2  5.8          F Wave Studies   NR F-Lat (ms) Lat Norm (ms) L-R F-Lat (ms)  Right Ulnar (Mrkrs) (Abd  Dig Min)     28.59 <33    EMG   Side Muscle Ins Act Fibs Psw Fasc Number Recrt Dur Dur. Amp Amp. Poly Poly. Comment  Right 1stDorInt Nml Nml Nml Nml Nml Nml Nml Nml Nml Nml Nml Nml N/A  Right Abd Poll Brev Nml Nml Nml Nml 1- Mod-R Few 1+ Nml Nml Nml Nml N/A  Right PronatorTeres Nml Nml Nml Nml Nml Nml Nml Nml Nml Nml Nml Nml N/A  Right Biceps Nml Nml Nml Nml Nml Nml Nml Nml Nml Nml Nml Nml N/A  Right Triceps Nml Nml Nml Nml Nml Nml Nml Nml Nml Nml Nml Nml N/A  Right Deltoid Nml Nml Nml Nml Nml Nml Nml Nml Nml Nml Nml Nml N/A  Left 1stDorInt Nml Nml Nml Nml Nml Nml Nml Nml Nml Nml Nml Nml N/A  Left Abd Poll Brev Nml Nml Nml Nml Nml Nml Nml Nml Nml Nml Nml Nml N/A  Left FlexPolLong Nml Nml Nml Nml Nml Nml Nml Nml Nml Nml Nml Nml N/A  Left PronatorTeres Nml Nml Nml Nml Nml Nml Nml Nml Nml Nml Nml Nml N/A  Left Biceps Nml Nml Nml Nml Nml Nml Nml Nml Nml Nml Nml Nml N/A  Left Triceps Nml Nml Nml Nml Nml Nml Nml Nml Nml Nml Nml Nml N/A       Waveforms:

## 2013-09-02 ENCOUNTER — Ambulatory Visit (INDEPENDENT_AMBULATORY_CARE_PROVIDER_SITE_OTHER): Payer: Medicare Other | Admitting: Neurology

## 2013-09-02 ENCOUNTER — Encounter: Payer: Self-pay | Admitting: Neurology

## 2013-09-02 VITALS — BP 128/70 | HR 60 | Ht 65.0 in | Wt 197.4 lb

## 2013-09-02 DIAGNOSIS — G5603 Carpal tunnel syndrome, bilateral upper limbs: Secondary | ICD-10-CM

## 2013-09-02 DIAGNOSIS — G5600 Carpal tunnel syndrome, unspecified upper limb: Secondary | ICD-10-CM

## 2013-09-02 MED ORDER — GABAPENTIN 100 MG PO CAPS: ORAL_CAPSULE | ORAL | Status: DC

## 2013-09-02 NOTE — Patient Instructions (Addendum)
1.  Start taking neurontin as follows:    AM  PM  Day 1-3   400mg   Day 4-6   500mg    Day 7-9 100mg   500mg   2.  Continue to use wrist splint on both hands  3.  Return to clinic in 20-months, or sooner as needed

## 2013-09-02 NOTE — Progress Notes (Signed)
Follow-up Visit   Date: 09/02/2013   Kaidon Kinker MRN: [id number] DOB: [birth year]   Interim History: Torres Hardenbrook is a 68 y.o. right-handed Caucasian male with history of OSA , CAD s/p PCI (2001), hyperlipidemia, and hypertension returning to the clinic for follow-up of bilateral CTS.  The patient was accompanied to the clinic by self.  History of present illness: Starting around 2010, he noticed tingling of the hands, worse on the right hand. Symptoms were initially intermittent, but recently become more constant. It is worse at nighttime, especially when holding a phone > 30 seconds, holding the steering wheel, or even using a fork to eat. At nighttime, he has tingling and pain that starts in his neck and radiates all the way into his hands. There is no sensory change over the dorsum of the hand, only the palmer surface. He does not notice pain at rest. He endorses neck pain with rotation and underwent cervical laminectomy in 1999. He has tried chiropractic adjustments without significant improvement. He has noticed that opening jars has become more effortful.    UPDATE 09/02/2013:  Patient is here to discuss EMG results which showed bilateral CTS (moderate).  He was started on neurontin which did seem to help his symptoms, but unfortunately became too sleepy on 300/600, so went back down to 300mg  BID, but does not notice as much benefit at the lower dose.   Medications:  Current Outpatient Prescriptions on File Prior to Visit  Medication Sig Dispense Refill  . aspirin 81 MG tablet Take 81 mg by mouth daily.      . CRESTOR 20 MG tablet TAKE 1 TABLET (20 MG TOTAL) BY MOUTH DAILY.  30 tablet  11  . gabapentin (NEURONTIN) 300 MG capsule Take 1 capsule (300 mg total) by mouth 2 (two) times daily.  60 capsule  3  . indomethacin (INDOCIN) 50 MG capsule TAKE 1 CAPSULE (50 MG TOTAL) BY MOUTH 3 (THREE) TIMES DAILY WITH MEALS.  60 capsule  10  . lisinopril-hydrochlorothiazide  (PRINZIDE,ZESTORETIC) 20-25 MG per tablet TAKE 1 TABLET BY MOUTH DAILY.  90 tablet  0  . Omega-3 Fatty Acids ([employer] FISH OIL) 1000 MG CAPS Take 1 capsule by mouth daily.  1 capsule  0  . sildenafil (VIAGRA) 100 MG tablet Take 1 tablet (100 mg total) by mouth as needed for erectile dysfunction.  10 tablet  11   No current facility-administered medications on file prior to visit.    Allergies:  Allergies  Allergen Reactions  . Atorvastatin     REACTION: itching     Review of Systems:  CONSTITUTIONAL: No fevers, chills, night sweats, or weight loss.   EYES: No visual changes or eye pain ENT: No hearing changes.  No history of nose bleeds.   RESPIRATORY: No cough, wheezing and shortness of breath.   CARDIOVASCULAR: Negative for chest pain, and palpitations.   GI: Negative for abdominal discomfort, blood in stools or black stools.  No recent change in bowel habits.   GU:  No history of incontinence.   MUSCLOSKELETAL: No history of joint pain or swelling.  No myalgias.   SKIN: Negative for lesions, rash, and itching.   ENDOCRINE: Negative for cold or heat intolerance, polydipsia or goiter.   PSYCH:  No depression or anxiety symptoms.   NEURO: As Above.   Vital Signs:  BP 128/70  Pulse 60  Ht 5\' 5"  (1.651 m)  Wt 197 lb 7 oz (89.557 kg)  BMI 32.86 kg/m2  SpO2  98%  Neurological Exam: MENTAL STATUS including orientation to time, place, person, recent and remote memory, attention span and concentration, language, and fund of knowledge is normal.  Speech is not dysarthric.  CRANIAL NERVES:   Face is symmetric.   MOTOR:  Motor strength is 5/5 in all extremities, including bilateral APB.  No atrophy.  Tone is normal.    MSRs:  Reflexes are 2+/4 throughout.  SENSORY:  Intact to vibration throughout.  COORDINATION/GAIT:  Gait narrow based and stable.   Data: Labs 12/01/2012: TSH 1.54   Labs 07/02/2011: vitamin B12 477, HbA1c 5.9   CT head wo contrast 10/25/2009  1. Chronic  ethmoid sinusitis. Chronic right frontal sinusitis.  2. Mild left mastoid effusion.  3. Several small lacunar infarcts are likely remote.  4. Chronic microvascular white matter disease.  5. No discrete acute intracranial findings are identified.  EMG 08/26/2013: 1. Bilateral median neuropathy at or distal to the wrist, consistent with the clinical diagnosis of carpal tunnel syndrome. Overall, these findings are moderate in degree electrically. 2. There is no evidence of a cervical motor radiculopathy affecting the upper extremities.   IMPRESSION/PLAN: 1.  Bilateral carpal tunnel syndrome, moderate  - Optimize neurontin to a dose that is tolerable, he became too sleep with neurontin 300/600 but did have benefit  - Prescription for 100mg  tablets provided and patient encouraged to take 400mg  x 3 days, then 500mg  qhs, then 100/500  - Encouraged to sign up with MyChart to give me an update in a a few weeks  - Start using wrist splint on both hands  - Avoid wrist hyperflexion  - Discussed referral to ortho for injection if symptoms do not improve, but patient is reluctant about steroids at this time 2.  Return to clinic in 2 months   The duration of this appointment visit was 25 minutes of face-to-face time with the patient.  Greater than 50% of this time was spent in counseling, explanation of diagnosis, planning of further management, and coordination of care.   Thank you for allowing me to participate in patient's care.  If I can answer any additional questions, I would be pleased to do so.    Sincerely,    Jiya Kissinger K. Posey Pronto, DO

## 2013-09-10 ENCOUNTER — Encounter: Payer: Self-pay | Admitting: Gastroenterology

## 2013-09-21 ENCOUNTER — Ambulatory Visit (INDEPENDENT_AMBULATORY_CARE_PROVIDER_SITE_OTHER): Payer: Medicare Other | Admitting: Family Medicine

## 2013-09-21 DIAGNOSIS — Z23 Encounter for immunization: Secondary | ICD-10-CM

## 2013-10-14 ENCOUNTER — Other Ambulatory Visit: Payer: Self-pay | Admitting: Family Medicine

## 2013-10-29 ENCOUNTER — Ambulatory Visit: Payer: Medicare Other | Admitting: Neurology

## 2013-11-05 ENCOUNTER — Other Ambulatory Visit: Payer: Self-pay | Admitting: Family Medicine

## 2013-11-19 ENCOUNTER — Ambulatory Visit (INDEPENDENT_AMBULATORY_CARE_PROVIDER_SITE_OTHER): Payer: Medicare Other | Admitting: Neurology

## 2013-11-19 ENCOUNTER — Ambulatory Visit: Payer: Medicare Other | Admitting: Neurology

## 2013-11-19 ENCOUNTER — Encounter: Payer: Self-pay | Admitting: Neurology

## 2013-11-19 VITALS — BP 128/64 | HR 95 | Ht 66.0 in | Wt 196.0 lb

## 2013-11-19 DIAGNOSIS — Z79899 Other long term (current) drug therapy: Secondary | ICD-10-CM

## 2013-11-19 DIAGNOSIS — G5603 Carpal tunnel syndrome, bilateral upper limbs: Secondary | ICD-10-CM

## 2013-11-19 DIAGNOSIS — G5602 Carpal tunnel syndrome, left upper limb: Secondary | ICD-10-CM

## 2013-11-19 DIAGNOSIS — G5601 Carpal tunnel syndrome, right upper limb: Secondary | ICD-10-CM

## 2013-11-19 MED ORDER — PREGABALIN 75 MG PO CAPS: 75.0000 mg | ORAL_CAPSULE | Freq: Two times a day (BID) | ORAL | Status: DC

## 2013-11-19 NOTE — Patient Instructions (Addendum)
1.  Start Lyrica 75mg  twice daily for one week, then increase to 1 tab in the morning and 2 bedtime. 2.  If tolerating the medication, please call my office so I can send a prescription to your pharmacy.  3.  Stop gabapentin 4.  We will send a referral to [facility] for evaluation for carpal tunnel injection 5.  Return to clinic in 35-months, or sooner as needed

## 2013-11-19 NOTE — Progress Notes (Signed)
Follow-up Visit   Date: 11/19/2013   Graysen Woodyard MRN: [id number] DOB: 21-Sep-1945   Interim History: Mitchell [name] is a 68 y.o. right-handed Caucasian male with history of OSA , CAD s/p PCI (2001), hyperlipidemia, and hypertension returning to the clinic for follow-up of bilateral CTS.  The patient was accompanied to the clinic by self.  History of present illness: Starting around 2010, he noticed tingling of the hands, worse on the right hand. Symptoms were initially intermittent, but recently become more constant. It is worse at nighttime, especially when holding a phone > 30 seconds, holding the steering wheel, or even using a fork to eat. At nighttime, he has tingling and pain that starts in his neck and radiates all the way into his hands. There is no sensory change over the dorsum of the hand, only the palmer surface.  He endorses neck pain with rotation and underwent cervical laminectomy in 1999. He has tried chiropractic adjustments without significant improvement. He has noticed that opening jars has become more effortful.   UPDATE 09/02/2013:  Patient is here to discuss EMG results which showed bilateral CTS (moderate).  He was started on neurontin which did seem to help his symptoms, but unfortunately became too sleepy on 300/600, so went back down to 300mg  BID, but does not notice as much benefit at the lower dose.  UPDATE 11/19/2013:  He increased his gabapentin 600mg  at bedtime and has been using a wrist splint, but denies any significant improvement of tingling and bilateral arm pain. No new neurological complaints.  He is reluctant about getting steroid injections because of severe pain associated with shoulder injection in the past for bursitis.     Medications:  Current Outpatient Prescriptions on File Prior to Visit  Medication Sig Dispense Refill  . aspirin 81 MG tablet Take 81 mg by mouth daily.    . CRESTOR 20 MG tablet TAKE 1 TABLET (20 MG TOTAL) BY MOUTH DAILY. 30  tablet 11  . indomethacin (INDOCIN) 50 MG capsule TAKE 1 CAPSULE (50 MG TOTAL) BY MOUTH 3 (THREE) TIMES DAILY WITH MEALS. 60 capsule 9  . lisinopril-hydrochlorothiazide (PRINZIDE,ZESTORETIC) 20-25 MG per tablet TAKE 1 TABLET BY MOUTH DAILY. 90 tablet 0  . Omega-3 Fatty Acids ([employer] FISH OIL) 1000 MG CAPS Take 1 capsule by mouth daily. 1 capsule 0  . sildenafil (VIAGRA) 100 MG tablet Take 1 tablet (100 mg total) by mouth as needed for erectile dysfunction. 10 tablet 11   No current facility-administered medications on file prior to visit.    Allergies:  Allergies  Allergen Reactions  . Atorvastatin     REACTION: itching     Review of Systems:  CONSTITUTIONAL: No fevers, chills, night sweats, or weight loss.   EYES: No visual changes or eye pain ENT: No hearing changes.  No history of nose bleeds.   RESPIRATORY: No cough, wheezing and shortness of breath.   CARDIOVASCULAR: Negative for chest pain, and palpitations.   GI: Negative for abdominal discomfort, blood in stools or black stools.  No recent change in bowel habits.   GU:  No history of incontinence.   MUSCLOSKELETAL: No history of joint pain or swelling.  No myalgias.   SKIN: Negative for lesions, rash, and itching.   ENDOCRINE: Negative for cold or heat intolerance, polydipsia or goiter.   PSYCH:  No depression or anxiety symptoms.   NEURO: As Above.   Vital Signs:  BP 128/64 mmHg  Pulse 95  Ht 5\' 6"  (1.676 m)  Wt 196 lb (88.905 kg)  BMI 31.65 kg/m2  SpO2 96%  Neurological Exam: MENTAL STATUS including orientation to time, place, and person is normal.    CRANIAL NERVES:   Face is symmetric.   MOTOR:  Motor strength is 5/5 in all extremities, including bilateral APB.  No atrophy.     SENSORY:  Intact to vibration throughout.  Hyperesthesia to light touch over median distribution bilaterally.  COORDINATION/GAIT:  Gait narrow based and stable.   Data: Labs 12/01/2012: TSH 1.54   Labs 07/02/2011: vitamin B12 477,  HbA1c 5.9   CT head wo contrast 10/25/2009  1. Chronic ethmoid sinusitis. Chronic right frontal sinusitis.  2. Mild left mastoid effusion.  3. Several small lacunar infarcts are likely remote.  4. Chronic microvascular white matter disease.  5. No discrete acute intracranial findings are identified.  EMG 08/26/2013: 1. Bilateral median neuropathy at or distal to the wrist, consistent with the clinical diagnosis of carpal tunnel syndrome. Overall, these findings are moderate in degree electrically. 2. There is no evidence of a cervical motor radiculopathy affecting the upper extremities.   IMPRESSION/PLAN: 1.  Bilateral carpal tunnel syndrome, moderate  - Optimize neurontin to a dose that is tolerable, he became too sleep with gabapentin 300/600 but did have benefit  - Due to increased sedation, unable to titrate gabapentin beyond 600mg  qhs.  Discontinue gabapentin.  - Will switch to Lyrica 75mg  twice daily, after one week increase to 75mg  in the morning and 150mg  at bedtime  - Referral to [facility] for steroid injection for bilateral CTS   - Continue using wrist splint on both hands  2.  Return to clinic in 2 months   The duration of this appointment visit was 20 minutes of face-to-face time with the patient.  Greater than 50% of this time was spent in counseling, explanation of diagnosis, planning of further management, and coordination of care.   Thank you for allowing me to participate in patient's care.  If I can answer any additional questions, I would be pleased to do so.    Sincerely,    Donika K. Posey Pronto, DO

## 2013-11-19 NOTE — Progress Notes (Signed)
Note faxed.

## 2013-12-31 ENCOUNTER — Other Ambulatory Visit: Payer: Medicare Other

## 2014-01-04 ENCOUNTER — Other Ambulatory Visit: Payer: Medicare Other

## 2014-01-21 ENCOUNTER — Encounter: Payer: Medicare Other | Admitting: Family Medicine

## 2014-01-22 ENCOUNTER — Other Ambulatory Visit: Payer: Self-pay | Admitting: Family Medicine

## 2014-02-02 ENCOUNTER — Other Ambulatory Visit (INDEPENDENT_AMBULATORY_CARE_PROVIDER_SITE_OTHER): Payer: Medicare Other | Admitting: *Deleted

## 2014-02-02 ENCOUNTER — Other Ambulatory Visit (INDEPENDENT_AMBULATORY_CARE_PROVIDER_SITE_OTHER): Payer: Medicare Other

## 2014-02-02 DIAGNOSIS — Z0000 Encounter for general adult medical examination without abnormal findings: Secondary | ICD-10-CM

## 2014-02-02 DIAGNOSIS — E785 Hyperlipidemia, unspecified: Secondary | ICD-10-CM

## 2014-02-02 LAB — COMPREHENSIVE METABOLIC PANEL
ALT: 16 U/L (ref 0–53)
AST: 17 U/L (ref 0–37)
Albumin: 4.4 g/dL (ref 3.5–5.2)
Alkaline Phosphatase: 57 U/L (ref 39–117)
BILIRUBIN TOTAL: 0.5 mg/dL (ref 0.2–1.2)
BUN: 26 mg/dL — ABNORMAL HIGH (ref 6–23)
CO2: 26 meq/L (ref 19–32)
CREATININE: 1.22 mg/dL (ref 0.40–1.50)
Calcium: 9.7 mg/dL (ref 8.4–10.5)
Chloride: 99 mEq/L (ref 96–112)
GFR: 62.95 mL/min (ref >60.00)
GLUCOSE: 115 mg/dL — AB (ref 70–99)
Potassium: 4.1 mEq/L (ref 3.5–5.1)
Sodium: 134 mEq/L — ABNORMAL LOW (ref 135–145)
Total Protein: 6.8 g/dL (ref 6.0–8.3)

## 2014-02-02 LAB — LIPID PANEL
CHOLESTEROL: 139 mg/dL (ref 0–200)
HDL: 38.5 mg/dL — AB (ref >39.00)
NonHDL: 100.5
TRIGLYCERIDES: 231 mg/dL — AB (ref 0.0–149.0)
Total CHOL/HDL Ratio: 4
VLDL: 46.2 mg/dL — AB (ref 0.0–40.0)

## 2014-02-02 LAB — POCT URINALYSIS DIPSTICK
Bilirubin, UA: NEGATIVE
Blood, UA: NEGATIVE
GLUCOSE UA: NEGATIVE
KETONES UA: NEGATIVE
Leukocytes, UA: NEGATIVE
Nitrite, UA: NEGATIVE
PH UA: 5
Protein, UA: NEGATIVE
Spec Grav, UA: 1.02
Urobilinogen, UA: 0.2

## 2014-02-02 LAB — TSH: TSH: 0.82 u[IU]/mL (ref 0.35–4.50)

## 2014-02-02 LAB — CBC WITH DIFFERENTIAL/PLATELET
BASOS ABS: 0 10*3/uL (ref 0.0–0.1)
BASOS PCT: 0.1 % (ref 0.0–3.0)
EOS PCT: 0.1 % (ref 0.0–5.0)
Eosinophils Absolute: 0 10*3/uL (ref 0.0–0.7)
HEMATOCRIT: 40 % (ref 39.0–52.0)
HEMOGLOBIN: 14 g/dL (ref 13.0–17.0)
LYMPHS ABS: 1 10*3/uL (ref 0.7–4.0)
Lymphocytes Relative: 7.9 % — ABNORMAL LOW (ref 12.0–46.0)
MCHC: 35 g/dL (ref 30.0–36.0)
MCV: 90.5 fl (ref 78.0–100.0)
MONOS PCT: 5.2 % (ref 3.0–12.0)
Monocytes Absolute: 0.7 10*3/uL (ref 0.1–1.0)
NEUTROS ABS: 11.3 10*3/uL — AB (ref 1.4–7.7)
Neutrophils Relative %: 86.7 % — ABNORMAL HIGH (ref 43.0–77.0)
Platelets: 175 10*3/uL (ref 150.0–400.0)
RBC: 4.43 Mil/uL (ref 4.22–5.81)
RDW: 13.3 % (ref 11.5–15.5)
WBC: 13.1 10*3/uL — ABNORMAL HIGH (ref 4.0–10.5)

## 2014-02-02 LAB — LDL CHOLESTEROL, DIRECT: Direct LDL: 76 mg/dL

## 2014-02-02 LAB — PSA: PSA: 1.72 ng/mL (ref 0.10–4.00)

## 2014-02-03 ENCOUNTER — Other Ambulatory Visit: Payer: Medicare Other

## 2014-02-11 ENCOUNTER — Encounter: Payer: Medicare Other | Admitting: Family Medicine

## 2014-02-14 ENCOUNTER — Other Ambulatory Visit: Payer: Medicare Other

## 2014-02-21 ENCOUNTER — Ambulatory Visit (INDEPENDENT_AMBULATORY_CARE_PROVIDER_SITE_OTHER)
Admission: RE | Admit: 2014-02-21 | Discharge: 2014-02-21 | Disposition: A | Discharge status: Home / Self Care | Payer: Medicare Other | Source: Ambulatory Visit | Attending: Family Medicine | Admitting: Family Medicine

## 2014-02-21 ENCOUNTER — Telehealth: Payer: Self-pay | Admitting: Family Medicine

## 2014-02-21 ENCOUNTER — Encounter: Payer: Medicare Other | Admitting: Family Medicine

## 2014-02-21 ENCOUNTER — Encounter: Payer: Self-pay | Admitting: Family Medicine

## 2014-02-21 ENCOUNTER — Encounter: Payer: Self-pay | Admitting: Neurology

## 2014-02-21 ENCOUNTER — Ambulatory Visit (INDEPENDENT_AMBULATORY_CARE_PROVIDER_SITE_OTHER): Payer: Medicare Other | Admitting: Family Medicine

## 2014-02-21 ENCOUNTER — Ambulatory Visit (INDEPENDENT_AMBULATORY_CARE_PROVIDER_SITE_OTHER): Payer: Medicare Other | Admitting: Neurology

## 2014-02-21 VITALS — BP 116/60 | HR 72 | Temp 98.0°F | Ht 66.0 in | Wt 195.0 lb

## 2014-02-21 VITALS — BP 120/70 | HR 59 | Ht 66.0 in | Wt 195.0 lb

## 2014-02-21 DIAGNOSIS — R05 Cough: Secondary | ICD-10-CM

## 2014-02-21 DIAGNOSIS — R059 Cough, unspecified: Secondary | ICD-10-CM

## 2014-02-21 DIAGNOSIS — G5603 Carpal tunnel syndrome, bilateral upper limbs: Secondary | ICD-10-CM

## 2014-02-21 DIAGNOSIS — G5602 Carpal tunnel syndrome, left upper limb: Secondary | ICD-10-CM

## 2014-02-21 DIAGNOSIS — G5601 Carpal tunnel syndrome, right upper limb: Secondary | ICD-10-CM

## 2014-02-21 DIAGNOSIS — Z0000 Encounter for general adult medical examination without abnormal findings: Secondary | ICD-10-CM

## 2014-02-21 MED ORDER — SILDENAFIL CITRATE 100 MG PO TABS: 100.0000 mg | ORAL_TABLET | ORAL | Status: DC | PRN

## 2014-02-21 MED ORDER — INDOMETHACIN 50 MG PO CAPS: ORAL_CAPSULE | ORAL | Status: DC

## 2014-02-21 MED ORDER — AZITHROMYCIN 250 MG PO TABS: ORAL_TABLET | ORAL | Status: DC

## 2014-02-21 MED ORDER — GABAPENTIN 100 MG PO CAPS: 100.0000 mg | ORAL_CAPSULE | Freq: Every day | ORAL | Status: DC

## 2014-02-21 MED ORDER — LISINOPRIL-HYDROCHLOROTHIAZIDE 20-25 MG PO TABS: 1.0000 | ORAL_TABLET | Freq: Every day | ORAL | Status: DC

## 2014-02-21 NOTE — Progress Notes (Signed)
   Subjective:    Patient ID: Mitchell [name], male    DOB: 1945/01/24, 69 y.o.   MRN: [id number]  HPI 69 yr old male for a cpx. He feels well except for a chest cold he has had for 2 weeks. He is coughing up green sputum. No fever.    Review of Systems  Constitutional: Negative.   HENT: Negative.   Eyes: Negative.   Respiratory: Positive for cough. Negative for apnea, choking, chest tightness, shortness of breath, wheezing and stridor.   Cardiovascular: Negative.   Gastrointestinal: Negative.   Genitourinary: Negative.   Musculoskeletal: Negative.   Skin: Negative.   Neurological: Negative.   Psychiatric/Behavioral: Negative.        Objective:   Physical Exam  Constitutional: He is oriented to person, place, and time. He appears well-developed and well-nourished. No distress.  HENT:  Head: Normocephalic and atraumatic.  Right Ear: External ear normal.  Left Ear: External ear normal.  Nose: Nose normal.  Mouth/Throat: Oropharynx is clear and moist. No oropharyngeal exudate.  Eyes: Conjunctivae and EOM are normal. Pupils are equal, round, and reactive to light. Right eye exhibits no discharge. Left eye exhibits no discharge. No scleral icterus.  Neck: Neck supple. No JVD present. No tracheal deviation present. No thyromegaly present.  Cardiovascular: Normal rate, regular rhythm, normal heart sounds and intact distal pulses.  Exam reveals no gallop and no friction rub.   No murmur heard. Pulmonary/Chest: Effort normal and breath sounds normal. No respiratory distress. He has no wheezes. He has no rales. He exhibits no tenderness.  Abdominal: Soft. Bowel sounds are normal. He exhibits no distension and no mass. There is no tenderness. There is no rebound and no guarding.  Genitourinary: Rectum normal, prostate normal and penis normal. Guaiac negative stool. No penile tenderness.  Musculoskeletal: Normal range of motion. He exhibits no edema or tenderness.  Lymphadenopathy:    He has  no cervical adenopathy.  Neurological: He is alert and oriented to person, place, and time. He has normal reflexes. No cranial nerve deficit. He exhibits normal muscle tone. Coordination normal.  Skin: Skin is warm and dry. No rash noted. He is not diaphoretic. No erythema. No pallor.  Psychiatric: He has a normal mood and affect. His behavior is normal. Judgment and thought content normal.          Assessment & Plan:  Well exam. Treat the bronchitis with a Zpack. Get a CXR

## 2014-02-21 NOTE — Progress Notes (Signed)
Follow-up Visit   Date: 02/21/2014   Mitchell [name] MRN: [id number] DOB: 06-13-1945   Interim History: Mitchell [name] is a 69 y.o. right-handed Caucasian male with history of OSA , CAD s/p PCI (2001), hyperlipidemia, and hypertension returning to the clinic for follow-up of bilateral CTS.  The patient was accompanied to the clinic by self.  History of present illness: Starting around 2010, he noticed tingling of the hands, worse on the right hand. Symptoms were initially intermittent, but recently become more constant. It is worse at nighttime, especially when holding a phone > 30 seconds, holding the steering wheel, or even using a fork to eat. At nighttime, he has tingling and pain that starts in his neck and radiates all the way into his hands. There is no sensory change over the dorsum of the hand, only the palmer surface.  He endorses neck pain with rotation and underwent cervical laminectomy in 1999. He has tried chiropractic adjustments without significant improvement. He has noticed that opening jars has become more effortful.   Follow-up 09/02/2013:  Patient is here to discuss EMG results which showed bilateral CTS (moderate).  He was started on neurontin which did seem to help his symptoms, but unfortunately became too sleepy on 300/600, so went back down to 300mg  BID, but does not notice as much benefit at the lower dose.  Follow-up 11/19/2013:  He increased his gabapentin 600mg  at bedtime and has been using a wrist splint, but denies any significant improvement of tingling and bilateral arm pain. No new neurological complaints.  He is reluctant about getting steroid injections because of severe pain associated with shoulder injection in the past for bursitis.   UPDATE 02/21/2013:  He underwent steroid injection for bilateral CTS and has noticed benefit.  He self-tapered his neurontin to 100mg  at bedtime.  He new weakness and frequency of tingling is much less.  He is using a wrist splint  almost nightly.     Medications:  Current Outpatient Prescriptions on File Prior to Visit  Medication Sig Dispense Refill  . aspirin 81 MG tablet Take 81 mg by mouth daily.    . CRESTOR 20 MG tablet TAKE 1 TABLET (20 MG TOTAL) BY MOUTH DAILY. 90 tablet 0  . indomethacin (INDOCIN) 50 MG capsule TAKE 1 CAPSULE (50 MG TOTAL) BY MOUTH 3 (THREE) TIMES DAILY WITH MEALS. 60 capsule 9  . lisinopril-hydrochlorothiazide (PRINZIDE,ZESTORETIC) 20-25 MG per tablet TAKE 1 TABLET BY MOUTH DAILY. 90 tablet 0  . Omega-3 Fatty Acids ([employer] FISH OIL) 1000 MG CAPS Take 1 capsule by mouth daily. 1 capsule 0  . sildenafil (VIAGRA) 100 MG tablet Take 1 tablet (100 mg total) by mouth as needed for erectile dysfunction. 10 tablet 11   No current facility-administered medications on file prior to visit.    Allergies:  Allergies  Allergen Reactions  . Atorvastatin     REACTION: itching     Review of Systems:  CONSTITUTIONAL: No fevers, chills, night sweats, or weight loss.   EYES: No visual changes or eye pain ENT: No hearing changes.  No history of nose bleeds.   RESPIRATORY: No cough, wheezing and shortness of breath.   CARDIOVASCULAR: Negative for chest pain, and palpitations.   GI: Negative for abdominal discomfort, blood in stools or black stools.  No recent change in bowel habits.   GU:  No history of incontinence.   MUSCLOSKELETAL: No history of joint pain or swelling.  No myalgias.   SKIN: Negative for lesions, rash,  and itching.   ENDOCRINE: Negative for cold or heat intolerance, polydipsia or goiter.   PSYCH:  No depression or anxiety symptoms.   NEURO: As Above.   Vital Signs:  BP 120/70 mmHg  Pulse 59  Ht 5\' 6"  (1.676 m)  Wt 195 lb (88.451 kg)  BMI 31.49 kg/m2  SpO2 97%  Neurological Exam: MENTAL STATUS including orientation to time, place, and person is normal.    CRANIAL NERVES:   Face is symmetric.   MOTOR:  Motor strength is 5/5 in all extremities, including bilateral APB.  No  atrophy.     SENSORY:  Intact to vibration and pin prick throughout (including over the median distribution)  COORDINATION/GAIT:  Gait narrow based and stable.   Data: EMG 08/26/2013: 1. Bilateral median neuropathy at or distal to the wrist, consistent with the clinical diagnosis of carpal tunnel syndrome. Overall, these findings are moderate in degree electrically. 2. There is no evidence of a cervical motor radiculopathy affecting the upper extremities.   IMPRESSION/PLAN: 1.  Bilateral carpal tunnel syndrome, clinically improved since getting steroid injections  - Continue gabapentin 100mg  qhs.  Stop lyrica.  - continue to use wrist splint  2.  Return to clinic as needed   The duration of this appointment visit was 15 minutes of face-to-face time with the patient.  Greater than 50% of this time was spent in counseling, explanation of diagnosis, planning of further management, and coordination of care.   Thank you for allowing me to participate in patient's care.  If I can answer any additional questions, I would be pleased to do so.    Sincerely,    Lendora Keys K. Posey Pronto, DO

## 2014-02-21 NOTE — Telephone Encounter (Signed)
Pt would like results xray

## 2014-02-22 NOTE — Telephone Encounter (Signed)
Pt following up on results.  Ok to leave message.  Pt is ina  Meeting at 9 am,

## 2014-02-23 NOTE — Telephone Encounter (Signed)
I left a voice message with results.

## 2014-03-10 ENCOUNTER — Telehealth: Payer: Self-pay | Admitting: Family Medicine

## 2014-03-10 NOTE — Telephone Encounter (Signed)
Yes, per Dr. Sarajane Jews. I called and pt will schedule this.

## 2014-03-10 NOTE — Telephone Encounter (Signed)
Pt not sure if he has had the chicken pox, but would like to know if he should get the shingles shot? Would like a cb

## 2014-03-11 ENCOUNTER — Ambulatory Visit: Payer: Medicare Other | Admitting: Family Medicine

## 2014-05-16 ENCOUNTER — Telehealth: Payer: Self-pay | Admitting: Family Medicine

## 2014-05-16 MED ORDER — LISINOPRIL-HYDROCHLOROTHIAZIDE 20-25 MG PO TABS: 1.0000 | ORAL_TABLET | Freq: Every day | ORAL | Status: DC

## 2014-05-16 MED ORDER — ROSUVASTATIN CALCIUM 20 MG PO TABS: ORAL_TABLET | ORAL | Status: DC

## 2014-05-16 NOTE — Telephone Encounter (Signed)
I called in both scripts and spoke with pt.

## 2014-05-16 NOTE — Telephone Encounter (Signed)
Pt is calling back and is aware nurse will call once rxs has been sent

## 2014-05-16 NOTE — Telephone Encounter (Signed)
Patient is out of town and need 7 days worth of  CRESTOR 20 MG tablet and lisinopril-hydrochlorothiazide (PRINZIDE,ZESTORETIC) 20-25 MG per tablet sent to [facility] Address: [address], [place], [place] [address]  [phone]  Patient would like a callback.

## 2014-06-23 ENCOUNTER — Other Ambulatory Visit: Payer: Self-pay | Admitting: Family Medicine

## 2014-06-23 MED ORDER — ROSUVASTATIN CALCIUM 20 MG PO TABS: ORAL_TABLET | ORAL | Status: DC

## 2014-06-23 NOTE — Telephone Encounter (Signed)
Refilled for 6 months.  Pt had last CPX on 02/21/14

## 2014-08-15 ENCOUNTER — Telehealth: Payer: Self-pay | Admitting: Cardiology

## 2014-08-15 NOTE — Telephone Encounter (Signed)
Last appointment with Dr. Stanford Breed 04/26/13   Patient states he is not having any problems just thinks he is over due for OV and stress test.  Appointment made for October 10th at 0900

## 2014-08-15 NOTE — Telephone Encounter (Signed)
New message       Pt want to know when he is due to see Dr Stanford Breed again?  Is it for a stress test or ov?

## 2014-10-24 ENCOUNTER — Ambulatory Visit: Payer: Medicare Other | Admitting: Cardiology

## 2014-11-18 ENCOUNTER — Ambulatory Visit: Payer: Medicare Other | Admitting: Cardiology

## 2014-11-21 ENCOUNTER — Other Ambulatory Visit: Payer: Self-pay | Admitting: Family Medicine

## 2014-12-06 NOTE — Progress Notes (Signed)
HPI: FU coronary artery disease. In September 2001, he underwent cardiac catheterization in the setting of a myocardial infarction. He was found to have moderate coronary disease in the left system. There was 100% right coronary artery. His ejection fraction was 50%. He had PTCA/stenting of the right coronary artery at that time. Last nuclear study in October of 2012 showed an ejection fraction of 55%. There was an inferior and inferolateral infarct with minimal peri-infarct ischemia. We have treated medically. He also had an abdominal ultrasound on July 16, 2007 that showed no aneurysm. Since last seen, the patient has dyspnea with more extreme activities but not with routine activities. It is relieved with rest. It is not associated with chest pain. There is no orthopnea, PND or pedal edema. There is no syncope or palpitations. There is no exertional chest pain.    Current Outpatient Prescriptions  Medication Sig Dispense Refill  . aspirin 81 MG tablet Take 81 mg by mouth daily.    . indomethacin (INDOCIN) 50 MG capsule TAKE 1 CAPSULE (50 MG TOTAL) BY MOUTH 3 (THREE) TIMES DAILY WITH MEALS. 60 capsule 11  . indomethacin (INDOCIN) 50 MG capsule TAKE 1 CAPSULE (50 MG TOTAL) BY MOUTH 3 (THREE) TIMES DAILY WITH MEALS. 60 capsule 3  . lisinopril-hydrochlorothiazide (PRINZIDE,ZESTORETIC) 20-25 MG per tablet Take 1 tablet by mouth daily. 30 tablet 0  . Omega-3 Fatty Acids ([employer] FISH OIL) 1000 MG CAPS Take 1 capsule by mouth daily. 1 capsule 0  . rosuvastatin (CRESTOR) 20 MG tablet TAKE 1 TABLET (20 MG TOTAL) BY MOUTH DAILY. 30 tablet 5  . sildenafil (VIAGRA) 100 MG tablet Take 1 tablet (100 mg total) by mouth as needed for erectile dysfunction. 10 tablet 11   No current facility-administered medications for this visit.     Past Medical History  Diagnosis Date  . Sleep apnea     Dr Gwenette Greet  . Hyperlipemia   . Hypertension   . Myocardial infarction [facility])     Dr Stanford Breed 2001  . CAD (coronary  artery disease)     sees Dr. Kirk Ruths   . Vitamin B 12 deficiency   . Depression   . ED (erectile dysfunction)   . Gout     Past Surgical History  Procedure Laterality Date  . Tonsillectomy    . Angioplasty  2001    stent placement  . Vasectomy reversal    . Knee surgery    . Strabimus      repair  . Cervical laminectomy    . Colonoscopy  08-23-11    per Dr. Sharlett Iles, repeat in 5 yrs    Social History   Social History  . Marital Status: Married    Spouse Name: N/A  . Number of Children: N/A  . Years of Education: N/A   Occupational History  . Not on file.   Social History Main Topics  . Smoking status: Former Smoker -- 1.00 packs/day for 49 years    Types: Cigarettes    Quit date: 01/15/2008  . Smokeless tobacco: Never Used  . Alcohol Use: 3.0 oz/week    5 Standard drinks or equivalent per week     Comment: 6 beers per night x 6 years  . Drug Use: No  . Sexual Activity: Not on file   Other Topics Concern  . Not on file   Social History Narrative   He recently retired from education (classroom, principal, and Contractor).   He lives with  wife.  They have three grown children, daughter is in dermatology residency at [employer].   Highest level of education:  masters    ROS: no fevers or chills, productive cough, hemoptysis, dysphasia, odynophagia, melena, hematochezia, dysuria, hematuria, rash, seizure activity, orthopnea, PND, pedal edema, claudication. Remaining systems are negative.  Physical Exam: Well-developed well-nourished in no acute distress.  Skin is warm and dry.  HEENT is normal.  Neck is supple.  Chest is clear to auscultation with normal expansion.  Cardiovascular exam is regular rate and rhythm.  Abdominal exam nontender or distended. No masses palpated. Extremities show no edema. neuro grossly intact  ECG Normal sinus rhythm at a rate of 79. Prior inferior infarct.

## 2014-12-16 ENCOUNTER — Encounter: Payer: Self-pay | Admitting: Cardiology

## 2014-12-16 ENCOUNTER — Ambulatory Visit (INDEPENDENT_AMBULATORY_CARE_PROVIDER_SITE_OTHER): Payer: Medicare Other | Admitting: Cardiology

## 2014-12-16 ENCOUNTER — Encounter: Payer: Self-pay | Admitting: *Deleted

## 2014-12-16 VITALS — BP 116/70 | HR 79 | Ht 66.0 in | Wt 189.2 lb

## 2014-12-16 DIAGNOSIS — R0989 Other specified symptoms and signs involving the circulatory and respiratory systems: Secondary | ICD-10-CM | POA: Diagnosis not present

## 2014-12-16 DIAGNOSIS — I2510 Atherosclerotic heart disease of native coronary artery without angina pectoris: Secondary | ICD-10-CM | POA: Diagnosis not present

## 2014-12-16 DIAGNOSIS — Z87891 Personal history of nicotine dependence: Secondary | ICD-10-CM | POA: Diagnosis not present

## 2014-12-16 MED ORDER — ATORVASTATIN CALCIUM 80 MG PO TABS: 80.0000 mg | ORAL_TABLET | Freq: Every day | ORAL | Status: DC

## 2014-12-16 NOTE — Assessment & Plan Note (Signed)
Schedule abdominal ultrasound to exclude aneurysm.

## 2014-12-16 NOTE — Assessment & Plan Note (Signed)
Patient's insuranceWill not cover Crestor moving forward. We will discontinue this and begin Lipitor 80 mg daily. Check lipids and liver 4 weeks later. Note there is a question of a rash to Lipitor previously. We will watch closely for any recurrence.

## 2014-12-16 NOTE — Assessment & Plan Note (Signed)
Continue aspirin and statin. Schedule nuclear study for risk stratification. 

## 2014-12-16 NOTE — Patient Instructions (Addendum)
Medication Instructions:   STOP CRESTOR WHEN FINISHED WITH CURRENT SUPPLY  START ATORVASTATIN 80 MG ONCE DAILY WHEN FINISHED WITH CRESTOR  Labwork:  Your physician recommends that you return for lab work in: [place] ATORVASTATIN  Testing/Procedures:  Your physician has requested that you have an abdominal aorta duplex. During this test, an ultrasound is used to evaluate the aorta. Allow 30 minutes for this exam. Do not eat after midnight the day before and avoid carbonated beverages   Your physician has requested that you have en exercise stress myoview. For further information please visit [email or web address]. Please follow instruction sheet, as given.    Follow-Up:  Your physician wants you to follow-up in: [place] will receive a reminder letter in the mail two months in advance. If you don't receive a letter, please call our office to schedule the follow-up appointment.   If you need a refill on your cardiac medications before your next appointment, please call your pharmacy.

## 2014-12-16 NOTE — Assessment & Plan Note (Signed)
Blood pressure controlled. Continue present medications. Check potassium and renal function. 

## 2014-12-27 ENCOUNTER — Other Ambulatory Visit: Payer: Self-pay | Admitting: Family Medicine

## 2014-12-28 NOTE — Telephone Encounter (Signed)
Should pt be taking this medication? 

## 2015-01-10 ENCOUNTER — Telehealth (HOSPITAL_COMMUNITY): Payer: Self-pay

## 2015-01-10 NOTE — Telephone Encounter (Signed)
Encounter complete. 

## 2015-01-12 ENCOUNTER — Ambulatory Visit (HOSPITAL_BASED_OUTPATIENT_CLINIC_OR_DEPARTMENT_OTHER)
Admission: RE | Admit: 2015-01-12 | Discharge: 2015-01-12 | Disposition: A | Discharge status: Home / Self Care | Payer: Medicare Other | Source: Ambulatory Visit | Attending: Cardiovascular Disease | Admitting: Cardiovascular Disease

## 2015-01-12 ENCOUNTER — Ambulatory Visit (HOSPITAL_COMMUNITY)
Admission: RE | Admit: 2015-01-12 | Discharge: 2015-01-12 | Disposition: A | Discharge status: Home / Self Care | Payer: Medicare Other | Source: Ambulatory Visit | Attending: Cardiovascular Disease | Admitting: Cardiovascular Disease

## 2015-01-12 DIAGNOSIS — R079 Chest pain, unspecified: Secondary | ICD-10-CM | POA: Diagnosis not present

## 2015-01-12 DIAGNOSIS — I2510 Atherosclerotic heart disease of native coronary artery without angina pectoris: Secondary | ICD-10-CM | POA: Diagnosis present

## 2015-01-12 DIAGNOSIS — R0989 Other specified symptoms and signs involving the circulatory and respiratory systems: Secondary | ICD-10-CM

## 2015-01-12 DIAGNOSIS — Z87891 Personal history of nicotine dependence: Secondary | ICD-10-CM | POA: Insufficient documentation

## 2015-01-12 DIAGNOSIS — Z8249 Family history of ischemic heart disease and other diseases of the circulatory system: Secondary | ICD-10-CM | POA: Diagnosis not present

## 2015-01-12 DIAGNOSIS — R9439 Abnormal result of other cardiovascular function study: Secondary | ICD-10-CM | POA: Insufficient documentation

## 2015-01-12 DIAGNOSIS — R42 Dizziness and giddiness: Secondary | ICD-10-CM | POA: Insufficient documentation

## 2015-01-12 DIAGNOSIS — R0609 Other forms of dyspnea: Secondary | ICD-10-CM | POA: Insufficient documentation

## 2015-01-12 DIAGNOSIS — G4733 Obstructive sleep apnea (adult) (pediatric): Secondary | ICD-10-CM | POA: Diagnosis not present

## 2015-01-12 DIAGNOSIS — R5383 Other fatigue: Secondary | ICD-10-CM | POA: Insufficient documentation

## 2015-01-12 LAB — MYOCARDIAL PERFUSION IMAGING
CHL CUP MPHR: 152 {beats}/min
CSEPEDS: 31 s
CSEPEW: 7.8 METS
CSEPHR: 93 %
CSEPPHR: 142 {beats}/min
Exercise duration (min): 6 min
LVDIAVOL: 116 mL
LVSYSVOL: 60 mL
RPE: 17
Rest HR: 58 {beats}/min
SDS: 1
SRS: 9
SSS: 10
TID: 1.16

## 2015-01-12 MED ORDER — TECHNETIUM TC 99M SESTAMIBI GENERIC - CARDIOLITE
10.9000 | Freq: Once | INTRAVENOUS | Status: AC | PRN
  Administered 2015-01-12: 10.9 via INTRAVENOUS

## 2015-01-12 MED ORDER — TECHNETIUM TC 99M SESTAMIBI GENERIC - CARDIOLITE
31.6000 | Freq: Once | INTRAVENOUS | Status: AC | PRN
  Administered 2015-01-12: 32 via INTRAVENOUS

## 2015-01-13 ENCOUNTER — Encounter: Payer: Self-pay | Admitting: Cardiology

## 2015-01-17 ENCOUNTER — Encounter: Payer: Self-pay | Admitting: Family Medicine

## 2015-01-17 NOTE — Telephone Encounter (Signed)
He will be due in August 2018

## 2015-02-08 ENCOUNTER — Telehealth: Payer: Self-pay | Admitting: Family Medicine

## 2015-02-08 NOTE — Telephone Encounter (Signed)
Patient would like to set up a CT Scan because he is coughing up yellowish green stuff at least once a day and this test is more accurate than a chest Xray.

## 2015-02-08 NOTE — Telephone Encounter (Signed)
I would need to see him OV first to evaluate this

## 2015-02-08 NOTE — Telephone Encounter (Signed)
Made patient an appt for 02/09/15 at 11:15am.

## 2015-02-09 ENCOUNTER — Encounter: Payer: Self-pay | Admitting: Family Medicine

## 2015-02-09 ENCOUNTER — Ambulatory Visit (INDEPENDENT_AMBULATORY_CARE_PROVIDER_SITE_OTHER): Payer: Medicare Other | Admitting: Family Medicine

## 2015-02-09 VITALS — BP 132/68 | HR 60 | Temp 97.9°F | Ht 66.0 in | Wt 188.0 lb

## 2015-02-09 DIAGNOSIS — R05 Cough: Secondary | ICD-10-CM

## 2015-02-09 DIAGNOSIS — R053 Chronic cough: Secondary | ICD-10-CM

## 2015-02-09 NOTE — Progress Notes (Signed)
   Subjective:    Patient ID: Mitchell [name], male    DOB: [birth year], 70 y.o.   MRN: [id number]  HPI Here asking about chronic coughing. He quit smoking 7 years ago. Ever since he has had a chronic daily cough which is sometimes dry but often produces yellow or green sputum. He has never seen blood in the sputum. No chest pain or SOB or fever. He had an unremarkable CXR one year ago.    Review of Systems  Constitutional: Negative.   HENT: Negative.   Eyes: Negative.   Respiratory: Positive for cough. Negative for apnea, choking, chest tightness, shortness of breath, wheezing and stridor.   Cardiovascular: Negative.        Objective:   Physical Exam  Constitutional: He is oriented to person, place, and time. He appears well-developed and well-nourished.  HENT:  Right Ear: External ear normal.  Left Ear: External ear normal.  Nose: Nose normal.  Mouth/Throat: Oropharynx is clear and moist.  Eyes: Conjunctivae are normal.  Neck: No thyromegaly present.  Cardiovascular: Normal rate, regular rhythm, normal heart sounds and intact distal pulses.   Pulmonary/Chest: Effort normal and breath sounds normal.  Lymphadenopathy:    He has no cervical adenopathy.  Neurological: He is alert and oriented to person, place, and time.          Assessment & Plan:  Cough in a former smoker consistent with chronic bronchitis. Evaluate with a chest CT scan. He will return in a few weeks for a cpx.

## 2015-02-15 ENCOUNTER — Encounter: Payer: Self-pay | Admitting: Family Medicine

## 2015-02-20 ENCOUNTER — Other Ambulatory Visit: Payer: Self-pay

## 2015-02-20 ENCOUNTER — Telehealth: Payer: Self-pay | Admitting: Family Medicine

## 2015-02-20 MED ORDER — SILDENAFIL CITRATE 20 MG PO TABS: 20.0000 mg | ORAL_TABLET | Freq: Every day | ORAL | Status: DC | PRN

## 2015-02-20 NOTE — Telephone Encounter (Signed)
Rx sent 

## 2015-02-20 NOTE — Telephone Encounter (Signed)
Pt request refill sildenafil (VIAGRA) 100 MG tablet Pt is requesting the generic.  Kristopher Oppenheim Nila Nephew

## 2015-02-20 NOTE — Telephone Encounter (Signed)
Call in Sildenafil 20 mg to take 5 tabs prn, #50 with 11 rf

## 2015-02-21 ENCOUNTER — Other Ambulatory Visit (INDEPENDENT_AMBULATORY_CARE_PROVIDER_SITE_OTHER): Payer: Medicare Other

## 2015-02-21 DIAGNOSIS — Z0000 Encounter for general adult medical examination without abnormal findings: Secondary | ICD-10-CM

## 2015-02-21 DIAGNOSIS — E785 Hyperlipidemia, unspecified: Secondary | ICD-10-CM

## 2015-02-21 DIAGNOSIS — Z125 Encounter for screening for malignant neoplasm of prostate: Secondary | ICD-10-CM

## 2015-02-21 DIAGNOSIS — R7989 Other specified abnormal findings of blood chemistry: Secondary | ICD-10-CM

## 2015-02-21 LAB — HEPATIC FUNCTION PANEL
ALBUMIN: 4.4 g/dL (ref 3.5–5.2)
ALT: 18 U/L (ref 0–53)
AST: 19 U/L (ref 0–37)
Alkaline Phosphatase: 69 U/L (ref 39–117)
BILIRUBIN TOTAL: 0.9 mg/dL (ref 0.2–1.2)
Bilirubin, Direct: 0.2 mg/dL (ref 0.0–0.3)
TOTAL PROTEIN: 6.7 g/dL (ref 6.0–8.3)

## 2015-02-21 LAB — CBC WITH DIFFERENTIAL/PLATELET
BASOS PCT: 0.6 % (ref 0.0–3.0)
Basophils Absolute: 0 10*3/uL (ref 0.0–0.1)
EOS PCT: 4.4 % (ref 0.0–5.0)
Eosinophils Absolute: 0.3 10*3/uL (ref 0.0–0.7)
HEMATOCRIT: 40.8 % (ref 39.0–52.0)
HEMOGLOBIN: 13.7 g/dL (ref 13.0–17.0)
Lymphocytes Relative: 17.3 % (ref 12.0–46.0)
Lymphs Abs: 1.2 10*3/uL (ref 0.7–4.0)
MCHC: 33.6 g/dL (ref 30.0–36.0)
MCV: 92.7 fl (ref 78.0–100.0)
MONO ABS: 0.6 10*3/uL (ref 0.1–1.0)
MONOS PCT: 8.8 % (ref 3.0–12.0)
Neutro Abs: 4.7 10*3/uL (ref 1.4–7.7)
Neutrophils Relative %: 68.9 % (ref 43.0–77.0)
Platelets: 168 10*3/uL (ref 150.0–400.0)
RBC: 4.4 Mil/uL (ref 4.22–5.81)
RDW: 13.3 % (ref 11.5–15.5)
WBC: 6.8 10*3/uL (ref 4.0–10.5)

## 2015-02-21 LAB — BASIC METABOLIC PANEL
BUN: 22 mg/dL (ref 6–23)
CHLORIDE: 95 meq/L — AB (ref 96–112)
CO2: 29 mEq/L (ref 19–32)
Calcium: 9.6 mg/dL (ref 8.4–10.5)
Creatinine, Ser: 1.18 mg/dL (ref 0.40–1.50)
GFR: 65.21 mL/min (ref >60.00)
Glucose, Bld: 108 mg/dL — ABNORMAL HIGH (ref 70–99)
POTASSIUM: 5.2 meq/L — AB (ref 3.5–5.1)
SODIUM: 130 meq/L — AB (ref 135–145)

## 2015-02-21 LAB — POC URINALSYSI DIPSTICK (AUTOMATED)
Bilirubin, UA: NEGATIVE
Blood, UA: NEGATIVE
GLUCOSE UA: NEGATIVE
Ketones, UA: NEGATIVE
NITRITE UA: NEGATIVE
PROTEIN UA: NEGATIVE
SPEC GRAV UA: 1.02
UROBILINOGEN UA: 0.2
pH, UA: 7

## 2015-02-21 LAB — LIPID PANEL
CHOL/HDL RATIO: 4
CHOLESTEROL: 133 mg/dL (ref 0–200)
HDL: 34.7 mg/dL — ABNORMAL LOW (ref >39.00)
NonHDL: 98.76
TRIGLYCERIDES: 262 mg/dL — AB (ref 0.0–149.0)
VLDL: 52.4 mg/dL — ABNORMAL HIGH (ref 0.0–40.0)

## 2015-02-21 LAB — PSA: PSA: 2.12 ng/mL (ref 0.10–4.00)

## 2015-02-21 LAB — TSH: TSH: 1.74 u[IU]/mL (ref 0.35–4.50)

## 2015-02-21 LAB — LDL CHOLESTEROL, DIRECT: LDL DIRECT: 67 mg/dL

## 2015-02-27 ENCOUNTER — Ambulatory Visit (INDEPENDENT_AMBULATORY_CARE_PROVIDER_SITE_OTHER)
Admission: RE | Admit: 2015-02-27 | Discharge: 2015-02-27 | Disposition: A | Discharge status: Home / Self Care | Payer: Medicare Other | Source: Ambulatory Visit | Attending: Family Medicine | Admitting: Family Medicine

## 2015-02-27 DIAGNOSIS — R911 Solitary pulmonary nodule: Secondary | ICD-10-CM | POA: Diagnosis not present

## 2015-02-27 DIAGNOSIS — R053 Chronic cough: Secondary | ICD-10-CM

## 2015-02-27 DIAGNOSIS — R05 Cough: Secondary | ICD-10-CM | POA: Diagnosis not present

## 2015-02-27 MED ORDER — IOHEXOL 300 MG/ML  SOLN
80.0000 mL | Freq: Once | INTRAMUSCULAR | Status: AC | PRN
  Administered 2015-02-27: 80 mL via INTRAVENOUS

## 2015-02-28 ENCOUNTER — Encounter: Payer: Self-pay | Admitting: Family Medicine

## 2015-02-28 ENCOUNTER — Ambulatory Visit (INDEPENDENT_AMBULATORY_CARE_PROVIDER_SITE_OTHER): Payer: Medicare Other | Admitting: Family Medicine

## 2015-02-28 VITALS — BP 145/71 | HR 71 | Temp 97.8°F | Ht 66.0 in | Wt 192.0 lb

## 2015-02-28 DIAGNOSIS — Z0000 Encounter for general adult medical examination without abnormal findings: Secondary | ICD-10-CM | POA: Diagnosis not present

## 2015-02-28 MED ORDER — LISINOPRIL-HYDROCHLOROTHIAZIDE 20-25 MG PO TABS: 1.0000 | ORAL_TABLET | Freq: Every day | ORAL | Status: DC

## 2015-02-28 MED ORDER — INDOMETHACIN 50 MG PO CAPS: ORAL_CAPSULE | ORAL | Status: DC

## 2015-02-28 NOTE — Progress Notes (Signed)
   Subjective:    Patient ID: Mitchell [name], male    DOB: May 09, 1945, 70 y.o.   MRN: [id number]  HPI 70 yr old male for a cpx. He feels well.   Review of Systems  Constitutional: Negative.   HENT: Negative.   Eyes: Negative.   Respiratory: Negative.   Cardiovascular: Negative.   Gastrointestinal: Negative.   Genitourinary: Negative.   Musculoskeletal: Negative.   Skin: Negative.   Neurological: Negative.   Psychiatric/Behavioral: Negative.        Objective:   Physical Exam  Constitutional: He is oriented to person, place, and time. He appears well-developed and well-nourished. No distress.  HENT:  Head: Normocephalic and atraumatic.  Right Ear: External ear normal.  Left Ear: External ear normal.  Nose: Nose normal.  Mouth/Throat: Oropharynx is clear and moist. No oropharyngeal exudate.  Eyes: Conjunctivae and EOM are normal. Pupils are equal, round, and reactive to light. Right eye exhibits no discharge. Left eye exhibits no discharge. No scleral icterus.  Neck: Neck supple. No JVD present. No tracheal deviation present. No thyromegaly present.  Cardiovascular: Normal rate, regular rhythm, normal heart sounds and intact distal pulses.  Exam reveals no gallop and no friction rub.   No murmur heard. Pulmonary/Chest: Effort normal and breath sounds normal. No respiratory distress. He has no wheezes. He has no rales. He exhibits no tenderness.  Abdominal: Soft. Bowel sounds are normal. He exhibits no distension and no mass. There is no tenderness. There is no rebound and no guarding.  Genitourinary: Rectum normal, prostate normal and penis normal. Guaiac negative stool. No penile tenderness.  Musculoskeletal: Normal range of motion. He exhibits no edema or tenderness.  Lymphadenopathy:    He has no cervical adenopathy.  Neurological: He is alert and oriented to person, place, and time. He has normal reflexes. No cranial nerve deficit. He exhibits normal muscle tone. Coordination  normal.  Skin: Skin is warm and dry. No rash noted. He is not diaphoretic. No erythema. No pallor.  Psychiatric: He has a normal mood and affect. His behavior is normal. Judgment and thought content normal.          Assessment & Plan:  Well exam. We discussed diet and exercise.

## 2015-02-28 NOTE — Progress Notes (Signed)
Pre visit review using our clinic review tool, if applicable. No additional management support is needed unless otherwise documented below in the visit note. 

## 2015-07-12 DIAGNOSIS — H18831 Recurrent erosion of cornea, right eye: Secondary | ICD-10-CM | POA: Diagnosis not present

## 2015-07-12 DIAGNOSIS — H40013 Open angle with borderline findings, low risk, bilateral: Secondary | ICD-10-CM | POA: Diagnosis not present

## 2015-07-24 DIAGNOSIS — H401111 Primary open-angle glaucoma, right eye, mild stage: Secondary | ICD-10-CM | POA: Diagnosis not present

## 2015-07-24 DIAGNOSIS — H401122 Primary open-angle glaucoma, left eye, moderate stage: Secondary | ICD-10-CM | POA: Diagnosis not present

## 2015-08-07 DIAGNOSIS — H401122 Primary open-angle glaucoma, left eye, moderate stage: Secondary | ICD-10-CM | POA: Diagnosis not present

## 2015-08-07 DIAGNOSIS — H401111 Primary open-angle glaucoma, right eye, mild stage: Secondary | ICD-10-CM | POA: Diagnosis not present

## 2015-09-25 ENCOUNTER — Telehealth: Payer: Self-pay | Admitting: Family Medicine

## 2015-09-25 DIAGNOSIS — Z209 Contact with and (suspected) exposure to unspecified communicable disease: Secondary | ICD-10-CM

## 2015-09-25 NOTE — Telephone Encounter (Signed)
PLEASE ADVISE THANKS.

## 2015-09-25 NOTE — Telephone Encounter (Signed)
Pt states mychart advised him of the need for a Hep c screening.  Can you put the order in? Pt has flu shot and pna on Friday, Sept 15 and will get at the same time. Ok to schedule?

## 2015-09-26 NOTE — Telephone Encounter (Signed)
I put in an order for this.

## 2015-09-27 NOTE — Telephone Encounter (Signed)
Pt scheduled  

## 2015-09-27 NOTE — Telephone Encounter (Signed)
Can you call pt to schedule lab appointment? 

## 2015-09-29 ENCOUNTER — Ambulatory Visit (INDEPENDENT_AMBULATORY_CARE_PROVIDER_SITE_OTHER): Payer: Medicare Other | Admitting: Family Medicine

## 2015-09-29 ENCOUNTER — Other Ambulatory Visit (INDEPENDENT_AMBULATORY_CARE_PROVIDER_SITE_OTHER): Payer: Medicare Other

## 2015-09-29 ENCOUNTER — Telehealth: Payer: Self-pay | Admitting: Family Medicine

## 2015-09-29 DIAGNOSIS — Z23 Encounter for immunization: Secondary | ICD-10-CM | POA: Diagnosis not present

## 2015-09-29 DIAGNOSIS — Z209 Contact with and (suspected) exposure to unspecified communicable disease: Secondary | ICD-10-CM | POA: Diagnosis not present

## 2015-09-29 NOTE — Telephone Encounter (Signed)
Pt requesting to check Vitamin B 12 level, just want to add on to CPE labs.

## 2015-09-30 LAB — HEPATITIS C ANTIBODY: HCV Ab: NEGATIVE

## 2015-10-02 NOTE — Telephone Encounter (Signed)
Per Dr. Sarajane Jews okay to put in future order.

## 2015-10-03 ENCOUNTER — Other Ambulatory Visit: Payer: Self-pay | Admitting: Family Medicine

## 2015-10-03 DIAGNOSIS — E538 Deficiency of other specified B group vitamins: Secondary | ICD-10-CM

## 2015-10-03 NOTE — Telephone Encounter (Signed)
I put in future lab order and spoke with pt.

## 2015-12-12 ENCOUNTER — Other Ambulatory Visit: Payer: Self-pay

## 2015-12-12 DIAGNOSIS — I2510 Atherosclerotic heart disease of native coronary artery without angina pectoris: Secondary | ICD-10-CM

## 2015-12-12 MED ORDER — ATORVASTATIN CALCIUM 80 MG PO TABS
80.0000 mg | ORAL_TABLET | Freq: Every day | ORAL | 0 refills | Status: DC
Start: 2015-12-12 — End: 2016-01-12

## 2015-12-18 NOTE — Progress Notes (Signed)
HPI: FU coronary artery disease. In September 2001, he underwent cardiac catheterization in the setting of a myocardial infarction. He was found to have moderate coronary disease in the left system. There was 100% right coronary artery. His ejection fraction was 50%. He had PTCA/stenting of the right coronary artery at that time. Nuclear study December 2016 showed ejection fraction 48%. Prior inferior infarct but no ischemia. Abdominal ultrasound December 2016 showed no aneurysm. Since last seen, the patient has dyspnea with more extreme activities but not with routine activities. It is relieved with rest. It is not associated with chest pain. There is no orthopnea, PND or pedal edema. There is no syncope or palpitations. There is no exertional chest pain.   Current Outpatient Prescriptions  Medication Sig Dispense Refill  . aspirin 81 MG tablet Take 81 mg by mouth daily.    Marland Kitchen atorvastatin (LIPITOR) 80 MG tablet Take 1 tablet (80 mg total) by mouth daily. 30 tablet 0  . indomethacin (INDOCIN) 50 MG capsule TAKE 1 CAPSULE (50 MG TOTAL) BY MOUTH 3 (THREE) TIMES DAILY WITH MEALS. 270 capsule 3  . lisinopril-hydrochlorothiazide (PRINZIDE,ZESTORETIC) 20-25 MG tablet Take 1 tablet by mouth daily. 90 tablet 3  . Omega-3 Fatty Acids ([employer] FISH OIL) 1000 MG CAPS Take 1 capsule by mouth daily. 1 capsule 0   No current facility-administered medications for this visit.      Past Medical History:  Diagnosis Date  . CAD (coronary artery disease)    sees Dr. Kirk Ruths   . Depression   . ED (erectile dysfunction)   . Gout   . Hyperlipemia   . Hypertension   . Myocardial infarction    Dr Stanford Breed 2001  . Sleep apnea    Dr Gwenette Greet  . Vitamin B 12 deficiency     Past Surgical History:  Procedure Laterality Date  . ANGIOPLASTY  2001   stent placement  . CERVICAL LAMINECTOMY    . COLONOSCOPY  08-23-11   per Dr. Sharlett Iles, repeat in 5 yrs  . KNEE SURGERY    . strabimus     repair  .  TONSILLECTOMY    . VASECTOMY REVERSAL      Social History   Social History  . Marital status: Married    Spouse name: N/A  . Number of children: N/A  . Years of education: N/A   Occupational History  . Not on file.   Social History Main Topics  . Smoking status: Former Smoker    Packs/day: 1.00    Years: 49.00    Types: Cigarettes    Quit date: 01/15/2008  . Smokeless tobacco: Never Used  . Alcohol use 3.0 oz/week    5 Standard drinks or equivalent per week     Comment: 5 beers per night   . Drug use: No  . Sexual activity: Not on file   Other Topics Concern  . Not on file   Social History Narrative   He recently retired from education (classroom, principal, and Contractor).   He lives with wife.  They have three grown children, daughter is in dermatology residency at [employer].   Highest level of education:  masters    Family History  Problem Relation Age of Onset  . Emphysema Mother   . Diabetes Mother   . COPD Mother   . Heart disease Father   . Peripheral vascular disease Father   . Hypertension Father   . Diabetes Brother   . Healthy  Daughter   . Healthy Son   . Colon cancer Neg Hx   . Stomach cancer Neg Hx     ROS: no fevers or chills, productive cough, hemoptysis, dysphasia, odynophagia, melena, hematochezia, dysuria, hematuria, rash, seizure activity, orthopnea, PND, pedal edema, claudication. Remaining systems are negative.  Physical Exam: Well-developed well-nourished in no acute distress.  Skin is warm and dry.  HEENT is normal.  Neck is supple.  Chest is clear to auscultation with normal expansion.  Cardiovascular exam is regular rate and rhythm.  Abdominal exam nontender or distended. No masses palpated. Extremities show no edema. neuro grossly intact  ECG-Sinus rhythm at a rate of 62. No ST changes.  A/P  1 coronary artery disease-continue aspirin and statin.  2 hypertension-blood pressure mildly elevated. Increase lisinopril  to 40 mg daily. Check potassium and renal function in 1 week. Follow blood pressure and increase as needed.  3 hyperlipidemia-continue statin. Lipids and liver monitored by primary care.  Kirk Ruths, MD

## 2015-12-21 ENCOUNTER — Ambulatory Visit (INDEPENDENT_AMBULATORY_CARE_PROVIDER_SITE_OTHER): Payer: Medicare Other | Admitting: Cardiology

## 2015-12-21 ENCOUNTER — Encounter: Payer: Self-pay | Admitting: Cardiology

## 2015-12-21 VITALS — BP 142/76 | HR 62 | Ht 66.0 in | Wt 192.0 lb

## 2015-12-21 DIAGNOSIS — I10 Essential (primary) hypertension: Secondary | ICD-10-CM | POA: Diagnosis not present

## 2015-12-21 DIAGNOSIS — E7800 Pure hypercholesterolemia, unspecified: Secondary | ICD-10-CM

## 2015-12-21 DIAGNOSIS — I2510 Atherosclerotic heart disease of native coronary artery without angina pectoris: Secondary | ICD-10-CM

## 2015-12-21 MED ORDER — LISINOPRIL 20 MG PO TABS: 20.0000 mg | ORAL_TABLET | Freq: Every day | ORAL | 3 refills | Status: DC

## 2015-12-21 NOTE — Patient Instructions (Signed)
Medication Instructions:   ADD LISINOPRIL 20 MG ONCE DAILY   Labwork:  Your physician recommends that you return for lab work ONE WEEK  Follow-Up:  Your physician wants you to follow-up in: [place] will receive a reminder letter in the mail two months in advance. If you don't receive a letter, please call our office to schedule the follow-up appointment.   If you need a refill on your cardiac medications before your next appointment, please call your pharmacy.

## 2015-12-28 ENCOUNTER — Encounter: Payer: Self-pay | Admitting: Family Medicine

## 2015-12-29 DIAGNOSIS — I2510 Atherosclerotic heart disease of native coronary artery without angina pectoris: Secondary | ICD-10-CM | POA: Diagnosis not present

## 2015-12-30 LAB — BASIC METABOLIC PANEL
BUN: 21 mg/dL (ref 7–25)
CHLORIDE: 96 mmol/L — AB (ref 98–110)
CO2: 25 mmol/L (ref 20–31)
CREATININE: 1.2 mg/dL (ref 0.70–1.25)
Calcium: 9.3 mg/dL (ref 8.6–10.3)
Glucose, Bld: 101 mg/dL — ABNORMAL HIGH (ref 65–99)
POTASSIUM: 5.2 mmol/L (ref 3.5–5.3)
Sodium: 130 mmol/L — ABNORMAL LOW (ref 135–146)

## 2016-01-12 ENCOUNTER — Other Ambulatory Visit: Payer: Self-pay

## 2016-01-12 DIAGNOSIS — I2510 Atherosclerotic heart disease of native coronary artery without angina pectoris: Secondary | ICD-10-CM

## 2016-01-12 MED ORDER — ATORVASTATIN CALCIUM 80 MG PO TABS
80.0000 mg | ORAL_TABLET | Freq: Every day | ORAL | 11 refills | Status: DC
Start: 2016-01-12 — End: 2017-01-11

## 2016-03-04 ENCOUNTER — Other Ambulatory Visit (INDEPENDENT_AMBULATORY_CARE_PROVIDER_SITE_OTHER): Payer: Medicare Other

## 2016-03-04 DIAGNOSIS — E538 Deficiency of other specified B group vitamins: Secondary | ICD-10-CM | POA: Diagnosis not present

## 2016-03-04 DIAGNOSIS — Z0000 Encounter for general adult medical examination without abnormal findings: Secondary | ICD-10-CM | POA: Diagnosis not present

## 2016-03-04 DIAGNOSIS — R7989 Other specified abnormal findings of blood chemistry: Secondary | ICD-10-CM

## 2016-03-04 LAB — CBC WITH DIFFERENTIAL/PLATELET
BASOS PCT: 1.4 % (ref 0.0–3.0)
Basophils Absolute: 0.1 10*3/uL (ref 0.0–0.1)
EOS PCT: 5.1 % — AB (ref 0.0–5.0)
Eosinophils Absolute: 0.3 10*3/uL (ref 0.0–0.7)
HEMATOCRIT: 38 % — AB (ref 39.0–52.0)
HEMOGLOBIN: 13.5 g/dL (ref 13.0–17.0)
LYMPHS PCT: 13.1 % (ref 12.0–46.0)
Lymphs Abs: 0.8 10*3/uL (ref 0.7–4.0)
MCHC: 35.5 g/dL (ref 30.0–36.0)
MCV: 91.7 fl (ref 78.0–100.0)
MONO ABS: 0.5 10*3/uL (ref 0.1–1.0)
MONOS PCT: 8.7 % (ref 3.0–12.0)
Neutro Abs: 4.4 10*3/uL (ref 1.4–7.7)
Neutrophils Relative %: 71.7 % (ref 43.0–77.0)
Platelets: 152 10*3/uL (ref 150.0–400.0)
RBC: 4.14 Mil/uL — AB (ref 4.22–5.81)
RDW: 13.1 % (ref 11.5–15.5)
WBC: 6.1 10*3/uL (ref 4.0–10.5)

## 2016-03-04 LAB — POC URINALSYSI DIPSTICK (AUTOMATED)
Bilirubin, UA: NEGATIVE
Blood, UA: NEGATIVE
Glucose, UA: NEGATIVE
KETONES UA: NEGATIVE
LEUKOCYTES UA: NEGATIVE
Nitrite, UA: NEGATIVE
Protein, UA: NEGATIVE
Spec Grav, UA: 1.02
UROBILINOGEN UA: 0.2
pH, UA: 7

## 2016-03-04 LAB — BASIC METABOLIC PANEL
BUN: 24 mg/dL — AB (ref 6–23)
CHLORIDE: 98 meq/L (ref 96–112)
CO2: 27 mEq/L (ref 19–32)
Calcium: 9 mg/dL (ref 8.4–10.5)
Creatinine, Ser: 1.16 mg/dL (ref 0.40–1.50)
GFR: 66.31 mL/min (ref >60.00)
Glucose, Bld: 109 mg/dL — ABNORMAL HIGH (ref 70–99)
POTASSIUM: 4.7 meq/L (ref 3.5–5.1)
SODIUM: 132 meq/L — AB (ref 135–145)

## 2016-03-04 LAB — HEPATIC FUNCTION PANEL
ALT: 21 U/L (ref 0–53)
AST: 21 U/L (ref 0–37)
Albumin: 4.4 g/dL (ref 3.5–5.2)
Alkaline Phosphatase: 69 U/L (ref 39–117)
BILIRUBIN TOTAL: 0.8 mg/dL (ref 0.2–1.2)
Bilirubin, Direct: 0.1 mg/dL (ref 0.0–0.3)
TOTAL PROTEIN: 6.7 g/dL (ref 6.0–8.3)

## 2016-03-04 LAB — LIPID PANEL
CHOL/HDL RATIO: 5
CHOLESTEROL: 152 mg/dL (ref 0–200)
HDL: 32.1 mg/dL — ABNORMAL LOW (ref >39.00)
NonHDL: 119.78
TRIGLYCERIDES: 287 mg/dL — AB (ref 0.0–149.0)
VLDL: 57.4 mg/dL — ABNORMAL HIGH (ref 0.0–40.0)

## 2016-03-04 LAB — LDL CHOLESTEROL, DIRECT: LDL DIRECT: 82 mg/dL

## 2016-03-04 LAB — TSH: TSH: 1.33 u[IU]/mL (ref 0.35–4.50)

## 2016-03-04 LAB — PSA: PSA: 1.93 ng/mL (ref 0.10–4.00)

## 2016-03-04 LAB — VITAMIN B12: Vitamin B-12: 367 pg/mL (ref 211–911)

## 2016-03-05 ENCOUNTER — Ambulatory Visit (INDEPENDENT_AMBULATORY_CARE_PROVIDER_SITE_OTHER): Payer: Medicare Other | Admitting: Family Medicine

## 2016-03-05 ENCOUNTER — Encounter: Payer: Self-pay | Admitting: Family Medicine

## 2016-03-05 VITALS — BP 147/84 | HR 66 | Temp 98.0°F | Ht 66.0 in | Wt 192.0 lb

## 2016-03-05 DIAGNOSIS — Z0000 Encounter for general adult medical examination without abnormal findings: Secondary | ICD-10-CM | POA: Diagnosis not present

## 2016-03-05 DIAGNOSIS — R911 Solitary pulmonary nodule: Secondary | ICD-10-CM | POA: Diagnosis not present

## 2016-03-05 NOTE — Progress Notes (Signed)
Pre visit review using our clinic review tool, if applicable. No additional management support is needed unless otherwise documented below in the visit note. 

## 2016-03-05 NOTE — Progress Notes (Signed)
   Subjective:    Patient ID: Mitchell [name], male    DOB: 09/06/1945, 70 y.o.   MRN: [id number]  HPI 71 yr old male for a wellness exam. He feels fine. He does not check his BP at home. He saw Dr. Stanford Breed a few months ago and he increased the dose of his Lisinopril. He had a chest CT in Feb 2017 showing a 38mm nodule in the right lung apex which was felt to be benign, but a one year follow up scan was recommended.    Review of Systems  Constitutional: Negative.   HENT: Negative.   Eyes: Negative.   Respiratory: Negative.   Cardiovascular: Negative.   Gastrointestinal: Negative.   Genitourinary: Negative.   Musculoskeletal: Negative.   Skin: Negative.   Neurological: Negative.   Psychiatric/Behavioral: Negative.        Objective:   Physical Exam  Constitutional: He is oriented to person, place, and time. He appears well-developed and well-nourished. No distress.  HENT:  Head: Normocephalic and atraumatic.  Right Ear: External ear normal.  Left Ear: External ear normal.  Nose: Nose normal.  Mouth/Throat: Oropharynx is clear and moist. No oropharyngeal exudate.  Eyes: Conjunctivae and EOM are normal. Pupils are equal, round, and reactive to light. Right eye exhibits no discharge. Left eye exhibits no discharge. No scleral icterus.  Neck: Neck supple. No JVD present. No tracheal deviation present. No thyromegaly present.  Cardiovascular: Normal rate, regular rhythm, normal heart sounds and intact distal pulses.  Exam reveals no gallop and no friction rub.   No murmur heard. Pulmonary/Chest: Effort normal and breath sounds normal. No respiratory distress. He has no wheezes. He has no rales. He exhibits no tenderness.  Abdominal: Soft. Bowel sounds are normal. He exhibits no distension and no mass. There is no tenderness. There is no rebound and no guarding.  Genitourinary: Rectum normal, prostate normal and penis normal. Rectal exam shows guaiac negative stool. No penile tenderness.    Musculoskeletal: Normal range of motion. He exhibits no edema or tenderness.  Lymphadenopathy:    He has no cervical adenopathy.  Neurological: He is alert and oriented to person, place, and time. He has normal reflexes. No cranial nerve deficit. He exhibits normal muscle tone. Coordination normal.  Skin: Skin is warm and dry. No rash noted. He is not diaphoretic. No erythema. No pallor.  Psychiatric: He has a normal mood and affect. His behavior is normal. Judgment and thought content normal.          Assessment & Plan:  Well exam. We discussed diet and exercise. I will set up a follow up chest CT soon. I advised him to obtain a BP cuff and to check his BP at home several times a week. He is due for a colonoscopy this summer.  Alysia Penna, MD

## 2016-03-15 ENCOUNTER — Ambulatory Visit (INDEPENDENT_AMBULATORY_CARE_PROVIDER_SITE_OTHER)
Admission: RE | Admit: 2016-03-15 | Discharge: 2016-03-15 | Disposition: A | Discharge status: Home / Self Care | Payer: Medicare Other | Source: Ambulatory Visit | Attending: Family Medicine | Admitting: Family Medicine

## 2016-03-15 ENCOUNTER — Telehealth: Payer: Self-pay | Admitting: Family Medicine

## 2016-03-15 DIAGNOSIS — R911 Solitary pulmonary nodule: Secondary | ICD-10-CM | POA: Diagnosis not present

## 2016-03-15 MED ORDER — IOPAMIDOL (ISOVUE-300) INJECTION 61%
80.0000 mL | Freq: Once | INTRAVENOUS | Status: AC | PRN
  Administered 2016-03-15: 80 mL via INTRAVENOUS

## 2016-03-15 NOTE — Telephone Encounter (Signed)
[place] is going out of [place] tomorrow morning

## 2016-03-15 NOTE — Telephone Encounter (Signed)
Pt calling to check the results from his CT this morning.

## 2016-03-23 ENCOUNTER — Other Ambulatory Visit: Payer: Self-pay | Admitting: Family Medicine

## 2016-04-11 DIAGNOSIS — H401111 Primary open-angle glaucoma, right eye, mild stage: Secondary | ICD-10-CM | POA: Diagnosis not present

## 2016-04-11 DIAGNOSIS — H401122 Primary open-angle glaucoma, left eye, moderate stage: Secondary | ICD-10-CM | POA: Diagnosis not present

## 2016-04-29 ENCOUNTER — Ambulatory Visit (INDEPENDENT_AMBULATORY_CARE_PROVIDER_SITE_OTHER): Payer: Medicare Other | Admitting: Family Medicine

## 2016-04-29 VITALS — BP 138/80 | HR 57 | Temp 98.3°F | Ht 66.0 in | Wt 192.0 lb

## 2016-04-29 DIAGNOSIS — M25551 Pain in right hip: Secondary | ICD-10-CM | POA: Diagnosis not present

## 2016-04-29 NOTE — Progress Notes (Signed)
Pre visit review using our clinic review tool, if applicable. No additional management support is needed unless otherwise documented below in the visit note. 

## 2016-04-29 NOTE — Patient Instructions (Signed)
WE NOW OFFER   Mitchell [name][facility] FAST TRACK!!!  SAME DAY Appointments for ACUTE CARE  Such as: Sprains, Injuries, cuts, abrasions, rashes, muscle pain, joint pain, back pain Colds, flu, sore throats, headache, allergies, cough, fever  Ear pain, sinus and eye infections Abdominal pain, nausea, vomiting, diarrhea, upset stomach Animal/insect bites  3 Easy Ways to Schedule: Walk-In Scheduling Call in scheduling Mychart Sign-up: [email or web address]         

## 2016-04-30 ENCOUNTER — Telehealth: Payer: Self-pay | Admitting: Family Medicine

## 2016-04-30 ENCOUNTER — Encounter: Payer: Self-pay | Admitting: Family Medicine

## 2016-04-30 MED ORDER — METHYLPREDNISOLONE 4 MG PO TBPK: ORAL_TABLET | ORAL | 0 refills | Status: DC

## 2016-04-30 NOTE — Telephone Encounter (Signed)
I sent script e-scribe to [facility] and spoke with pt.

## 2016-04-30 NOTE — Progress Notes (Signed)
   Subjective:    Patient ID: Mitchell [name], male    DOB: 1946-01-10, 71 y.o.   MRN: [id number]  HPI Here for a 2 day hx of pain in the right hip. No recent trauma but he has been exercising vigorously on a treadmill, and the pain began while he was on the treadmill. The pain is centered in the lateral right hip. No back pain and no radiation down the leg. No weakness or numbness in the leg. He has tried Ibuprofen and Tylenol and heat with little relief.    Review of Systems  Constitutional: Negative.   Respiratory: Negative.   Cardiovascular: Negative.   Gastrointestinal: Negative.   Genitourinary: Negative.   Musculoskeletal: Positive for arthralgias. Negative for back pain.       Objective:   Physical Exam  Constitutional: He appears well-developed and well-nourished.  In mild pain  Musculoskeletal:  He is tender over the right lateral hip area. No back tenderness. ROM of the spine and both hip joints are normal.           Assessment & Plan:  Hip bursitis. Rest, no treadmill use for several weeks. He has Indomethacin at home, so I suggested he takes this TID as needed. Recheck prn. Alysia Penna, MD

## 2016-04-30 NOTE — Telephone Encounter (Signed)
I understand. I think a prednisone taper would work better. Call in a Medrol dose pack.

## 2016-04-30 NOTE — Telephone Encounter (Signed)
° ° °  Pt call to say he was told to double up on the below med because of inflamation. He call to say it is not working and would like a call back    indomethacin (INDOCIN) 50 MG capsule

## 2016-05-14 ENCOUNTER — Other Ambulatory Visit: Payer: Self-pay | Admitting: Family Medicine

## 2016-05-15 NOTE — Telephone Encounter (Signed)
The Lisinopril HCT is the correct med. I took Lisinopril off the list

## 2016-05-15 NOTE — Telephone Encounter (Signed)
Can you please clarify script? There is 2 listed in chart.

## 2016-07-10 ENCOUNTER — Encounter: Payer: Self-pay | Admitting: Gastroenterology

## 2016-07-10 ENCOUNTER — Encounter: Payer: Self-pay | Admitting: Internal Medicine

## 2016-08-12 ENCOUNTER — Other Ambulatory Visit: Payer: Self-pay | Admitting: Family Medicine

## 2016-08-14 DIAGNOSIS — H401111 Primary open-angle glaucoma, right eye, mild stage: Secondary | ICD-10-CM | POA: Diagnosis not present

## 2016-08-14 DIAGNOSIS — H401122 Primary open-angle glaucoma, left eye, moderate stage: Secondary | ICD-10-CM | POA: Diagnosis not present

## 2016-08-14 DIAGNOSIS — B399 Histoplasmosis, unspecified: Secondary | ICD-10-CM | POA: Diagnosis not present

## 2016-08-26 ENCOUNTER — Ambulatory Visit: Payer: Medicare Other

## 2016-08-26 VITALS — Ht 64.0 in | Wt 187.2 lb

## 2016-08-26 DIAGNOSIS — Z1211 Encounter for screening for malignant neoplasm of colon: Secondary | ICD-10-CM

## 2016-08-26 NOTE — Progress Notes (Signed)
No allergies to eggs or soy No home oxygen No diet meds No past problems with anesthesia  Registered emmi  Instructions were reviewed with patient multiple times without the pt voicing understanding.  After each review the pt didn't seem to get what was being reviewed even after showing him in writing while verbally instructing.  At one point the pt became angry that RN was reviewing slowly while showing pt in writing.  Pt voiced that he felt insulted by this method of teaching so RN gave him the opportunity for open-discussion.  By the end of previsit pt voiced understanding of prep instructions.

## 2016-08-29 ENCOUNTER — Telehealth: Payer: Self-pay | Admitting: Gastroenterology

## 2016-08-29 DIAGNOSIS — Z1211 Encounter for screening for malignant neoplasm of colon: Secondary | ICD-10-CM

## 2016-08-29 MED ORDER — SUPREP BOWEL PREP KIT 17.5-3.13-1.6 GM/177ML PO SOLN: 1.0000 | Freq: Once | ORAL | 0 refills | Status: AC

## 2016-08-29 NOTE — Telephone Encounter (Signed)
Done. Pt called and informed that Suprep was sent to [facility] at [place]. Angel/PV

## 2016-09-05 ENCOUNTER — Ambulatory Visit (AMBULATORY_SURGERY_CENTER): Payer: Medicare Other | Admitting: Gastroenterology

## 2016-09-05 ENCOUNTER — Encounter: Payer: Self-pay | Admitting: Gastroenterology

## 2016-09-05 VITALS — BP 129/66 | HR 69 | Temp 97.8°F | Resp 10 | Ht 64.0 in | Wt 187.0 lb

## 2016-09-05 DIAGNOSIS — I2510 Atherosclerotic heart disease of native coronary artery without angina pectoris: Secondary | ICD-10-CM | POA: Diagnosis not present

## 2016-09-05 DIAGNOSIS — Z86010 Personal history of colonic polyps: Secondary | ICD-10-CM

## 2016-09-05 DIAGNOSIS — D123 Benign neoplasm of transverse colon: Secondary | ICD-10-CM | POA: Diagnosis not present

## 2016-09-05 DIAGNOSIS — I252 Old myocardial infarction: Secondary | ICD-10-CM | POA: Diagnosis not present

## 2016-09-05 DIAGNOSIS — I10 Essential (primary) hypertension: Secondary | ICD-10-CM | POA: Diagnosis not present

## 2016-09-05 HISTORY — PX: COLONOSCOPY: SHX174

## 2016-09-05 MED ORDER — SODIUM CHLORIDE 0.9 % IV SOLN: 500.0000 mL | INTRAVENOUS | Status: DC

## 2016-09-05 NOTE — Progress Notes (Signed)
Report given to PACU, vss 

## 2016-09-05 NOTE — Op Note (Signed)
Mitchell [name]: Mitchell [name] Procedure Date: 09/05/2016 10:33 AM MRN: [id number] Endoscopist: Mallie Mussel L. Loletha Carrow , MD Age: 71 Referring MD:  Date of Birth: [birth year] Gender: Male Account #: [id number] Procedure:                Colonoscopy Indications:              Surveillance: Personal history of adenomatous                            polyps on last colonoscopy > 5 years ago (TA x 2 in                            06/2008, then no polyps in 08/2011) Medicines:                Monitored Anesthesia Care Procedure:                Pre-Anesthesia Assessment:                           - Prior to the procedure, a History and Physical                            was performed, and patient medications and                            allergies were reviewed. The patient's tolerance of                            previous anesthesia was also reviewed. The risks                            and benefits of the procedure and the sedation                            options and risks were discussed with the patient.                            All questions were answered, and informed consent                            was obtained. Anticoagulants: The patient has taken                            aspirin. It was decided not to withhold this                            medication prior to the procedure. ASA Grade                            Assessment: II - A patient with mild systemic                            disease. After reviewing the risks and benefits,  the patient was deemed in satisfactory condition to                            undergo the procedure.                           After obtaining informed consent, the colonoscope                            was passed under direct vision. Throughout the                            procedure, the patient's blood pressure, pulse, and                            oxygen saturations were monitored continuously. The                       Colonoscope was introduced through the anus and                            advanced to the the cecum, identified by                            appendiceal orifice and ileocecal valve. The                            colonoscopy was performed without difficulty. The                            patient tolerated the procedure well. The quality                            of the bowel preparation was excellent. The                            ileocecal valve, appendiceal orifice, and rectum                            were photographed. The quality of the bowel                            preparation was evaluated using the BBPS [facility]                            Bowel Preparation Scale) with scores of: Right                            Colon = 3, Transverse Colon = 3 and Left Colon = 2.                            The total BBPS score equals 8. The bowel  preparation used was SUPREP. Scope In: 10:47:06 AM Scope Out: 11:01:33 AM Scope Withdrawal Time: 0 hours 9 minutes 10 seconds  Total Procedure Duration: 0 hours 14 minutes 27 seconds  Findings:                 The perianal and digital rectal examinations were                            normal.                           Diverticula were found in the left colon.                           A 4 mm polyp was found in the distal transverse                            colon. The polyp was sessile. The polyp was removed                            with a cold snare. Resection and retrieval were                            complete.                           Retroflexion in the rectum was not performed due to                            anatomy.                           The exam was otherwise without abnormality. Complications:            No immediate complications. Estimated Blood Loss:     Estimated blood loss: none. Impression:               - Diverticulosis in the left colon.                           - One 4  mm polyp in the distal transverse colon,                            removed with a cold snare. Resected and retrieved.                           - The examination was otherwise normal. Recommendation:           - Patient has a contact number available for                            emergencies. The signs and symptoms of potential                            delayed complications were discussed with the  patient. Return to normal activities tomorrow.                            Written discharge instructions were provided to the                            patient.                           - Resume previous diet.                           - Continue present medications.                           - Await pathology results.                           - Repeat colonoscopy is recommended for                            surveillance. The colonoscopy date will be                            determined after pathology results from today's                            exam become available for review. [name] L. Loletha Carrow, MD 09/05/2016 11:13:06 AM This report has been signed electronically.

## 2016-09-05 NOTE — Patient Instructions (Signed)
YOU HAD AN ENDOSCOPIC PROCEDURE TODAY AT THE [facility]:   Refer to the procedure report that was given to you for any specific questions about what was found during the examination.  If the procedure report does not answer your questions, please call your gastroenterologist to clarify.  If you requested that your care partner not be given the details of your procedure findings, then the procedure report has been included in a sealed envelope for you to review at your convenience later.  YOU SHOULD EXPECT: Some feelings of bloating in the abdomen. Passage of more gas than usual.  Walking can help get rid of the air that was put into your GI tract during the procedure and reduce the bloating. If you had a lower endoscopy (such as a colonoscopy or flexible sigmoidoscopy) you may notice spotting of blood in your stool or on the toilet paper. If you underwent a bowel prep for your procedure, you may not have a normal bowel movement for a few days.  Please Note:  You might notice some irritation and congestion in your nose or some drainage.  This is from the oxygen used during your procedure.  There is no need for concern and it should clear up in a day or so.  SYMPTOMS TO REPORT IMMEDIATELY:   Following lower endoscopy (colonoscopy or flexible sigmoidoscopy):  Excessive amounts of blood in the stool  Significant tenderness or worsening of abdominal pains  Swelling of the abdomen that is new, acute  Fever of 100F or higher   For urgent or emergent issues, a gastroenterologist can be reached at any hour by calling [phone].   DIET:  We do recommend a small meal at first, but then you may proceed to your regular diet.  Drink plenty of fluids but you should avoid alcoholic beverages for 24 hours.  ACTIVITY:  You should plan to take it easy for the rest of today and you should NOT DRIVE or use heavy machinery until tomorrow (because of the sedation medicines used during the test).     FOLLOW UP: Our staff will call the number listed on your records the next business day following your procedure to check on you and address any questions or concerns that you may have regarding the information given to you following your procedure. If we do not reach you, we will leave a message.  However, if you are feeling well and you are not experiencing any problems, there is no need to return our call.  We will assume that you have returned to your regular daily activities without incident.  If any biopsies were taken you will be contacted by phone or by letter within the next 1-3 weeks.  Please call us at [phone] if you have not heard about the biopsies in 3 weeks.    SIGNATURES/CONFIDENTIALITY: You and/or your care partner have signed paperwork which will be entered into your electronic medical record.  These signatures attest to the fact that that the information above on your After Visit Summary has been reviewed and is understood.  Full responsibility of the confidentiality of this discharge information lies with you and/or your care-partner.  Read all of the handouts given to you by your recovery room nurse. 

## 2016-09-05 NOTE — Progress Notes (Signed)
Called to room to assist during endoscopic procedure.  Patient ID and intended procedure confirmed with present staff. Received instructions for my participation in the procedure from the performing physician.  

## 2016-09-06 ENCOUNTER — Telehealth: Payer: Self-pay | Admitting: *Deleted

## 2016-09-06 NOTE — Telephone Encounter (Signed)
  Follow up Call-  Call back number 09/05/2016  Post procedure Call Back phone  # [phone], speak with wife, Vaughan Basta  Permission to leave phone message Yes  Some recent data might be hidden     Patient questions:  Do you have a fever, pain , or abdominal swelling? No. Pain Score  0 *  Have you tolerated food without any problems? Yes.    Have you been able to return to your normal activities? Yes.    Do you have any questions about your discharge instructions: Diet   No. Medications  No. Follow up visit  No.  Do you have questions or concerns about your Care? No.  Actions: * If pain score is 4 or above: No action needed, pain <4.

## 2016-09-11 ENCOUNTER — Encounter: Payer: Self-pay | Admitting: Gastroenterology

## 2016-10-03 ENCOUNTER — Encounter: Payer: Self-pay | Admitting: Family Medicine

## 2016-10-09 ENCOUNTER — Ambulatory Visit (INDEPENDENT_AMBULATORY_CARE_PROVIDER_SITE_OTHER): Payer: Medicare Other | Admitting: *Deleted

## 2016-10-09 DIAGNOSIS — Z23 Encounter for immunization: Secondary | ICD-10-CM

## 2016-10-15 ENCOUNTER — Telehealth: Payer: Self-pay | Admitting: Cardiology

## 2016-10-15 NOTE — Telephone Encounter (Signed)
Pt.notified

## 2016-10-15 NOTE — Telephone Encounter (Signed)
New message    Pt is calling to schedule yearly appt. He wants to know if he needs blood work before appt in December?

## 2016-10-15 NOTE — Telephone Encounter (Signed)
Will arrange at time of ov Mitchell [name]

## 2016-11-03 ENCOUNTER — Other Ambulatory Visit: Payer: Self-pay | Admitting: Family Medicine

## 2016-12-20 ENCOUNTER — Ambulatory Visit (INDEPENDENT_AMBULATORY_CARE_PROVIDER_SITE_OTHER): Payer: Medicare Other | Admitting: Family Medicine

## 2016-12-20 ENCOUNTER — Encounter: Payer: Self-pay | Admitting: Family Medicine

## 2016-12-20 ENCOUNTER — Ambulatory Visit: Payer: Medicare Other | Admitting: Family Medicine

## 2016-12-20 VITALS — BP 120/60 | HR 72 | Temp 97.7°F | Wt 191.2 lb

## 2016-12-20 DIAGNOSIS — M1990 Unspecified osteoarthritis, unspecified site: Secondary | ICD-10-CM | POA: Insufficient documentation

## 2016-12-20 DIAGNOSIS — M150 Primary generalized (osteo)arthritis: Secondary | ICD-10-CM | POA: Diagnosis not present

## 2016-12-20 DIAGNOSIS — L309 Dermatitis, unspecified: Secondary | ICD-10-CM | POA: Diagnosis not present

## 2016-12-20 DIAGNOSIS — M159 Polyosteoarthritis, unspecified: Secondary | ICD-10-CM

## 2016-12-20 DIAGNOSIS — M8949 Other hypertrophic osteoarthropathy, multiple sites: Secondary | ICD-10-CM

## 2016-12-20 MED ORDER — DICLOFENAC SODIUM 75 MG PO TBEC: 75.0000 mg | DELAYED_RELEASE_TABLET | Freq: Two times a day (BID) | ORAL | 5 refills | Status: DC

## 2016-12-20 MED ORDER — TRIAMCINOLONE ACETONIDE 0.1 % EX CREA
1.0000 | TOPICAL_CREAM | Freq: Three times a day (TID) | CUTANEOUS | 5 refills | Status: DC
Start: 2016-12-20 — End: 2017-08-12

## 2016-12-20 NOTE — Progress Notes (Signed)
   Subjective:    Patient ID: Mitchell [name], male    DOB: [birth year], 71 y.o.   MRN: [id number]  HPI Here for 2 issues. First he has developed diffuse joint pains that often wake him up in the night. These involve the shoulders, hips, knees, and hands. The joints do not swell. He has periodic gout episodes as well, but these are very different. Ibuprofen helps a little. Also he has had 2 areas of discoloration on the skin, one in the central lower back and the other on the left flank.these spots are itchy.    Review of Systems  Constitutional: Negative.   Respiratory: Negative.   Cardiovascular: Negative.   Musculoskeletal: Positive for arthralgias. Negative for joint swelling.  Skin: Positive for color change.       Objective:   Physical Exam  Constitutional: He appears well-developed and well-nourished.  Cardiovascular: Normal rate, regular rhythm, normal heart sounds and intact distal pulses.  Pulmonary/Chest: Effort normal and breath sounds normal. No respiratory distress. He has no wheezes. He has no rales.  Musculoskeletal:  All his PIPs and DIPs are swollen and tender. The MCPs and wrists are normal.   Skin:  He has areas on the left flank and lower back that are scaly, macular and light tan in color           Assessment & Plan:  He has osteoarthritis in addition to gout. Try Diclofenac 75 mg bid. Also he has eczema and he will use Triamcinolone cream prn. Alysia Penna, MD

## 2016-12-23 ENCOUNTER — Ambulatory Visit: Payer: Medicare Other | Admitting: Student

## 2016-12-24 NOTE — Progress Notes (Signed)
HPI: FU coronary artery disease. In September 2001, he underwent cardiac catheterization in the setting of a myocardial infarction. He was found to have moderate coronary disease in the left system. There was 100% right coronary artery. His ejection fraction was 50%. He had PTCA/stenting of the right coronary artery at that time. Nuclear study December 2016 showed ejection fraction 48%. Prior inferior infarct but no ischemia. Abdominal ultrasound December 2016 showed no aneurysm. Since last seen, the patient denies any dyspnea on exertion, orthopnea, PND, pedal edema, palpitations, syncope or chest pain.   Current Outpatient Medications  Medication Sig Dispense Refill  . aspirin 81 MG tablet Take 81 mg by mouth daily.    Marland Kitchen atorvastatin (LIPITOR) 80 MG tablet Take 1 tablet (80 mg total) by mouth daily. 30 tablet 11  . diclofenac (VOLTAREN) 75 MG EC tablet Take 1 tablet (75 mg total) by mouth 2 (two) times daily. 60 tablet 5  . lisinopril (PRINIVIL,ZESTRIL) 20 MG tablet Take 20 mg by mouth daily.    Marland Kitchen lisinopril-hydrochlorothiazide (PRINZIDE,ZESTORETIC) 20-25 MG tablet TAKE 1 TABLET BY MOUTH DAILY. 90 tablet 0  . LISINOPRIL-HYDROCHLOROTHIAZIDE PO Take by mouth. 40mg  lisinopril 25 hydrochlorothiazide    . Omega-3 Fatty Acids ([employer] FISH OIL) 1000 MG CAPS Take 1 capsule by mouth daily. (Patient taking differently: Take 1 capsule by mouth daily. 1400 mg) 1 capsule 0  . PRESCRIPTION MEDICATION Glaucoma eye gtts    . triamcinolone cream (KENALOG) 0.1 % Apply 1 application topically 3 (three) times daily. 45 g 5   Current Facility-Administered Medications  Medication Dose Route Frequency Provider Last Rate Last Dose  . 0.9 %  sodium chloride infusion  500 mL Intravenous Continuous Doran Stabler, MD         Past Medical History:  Diagnosis Date  . CAD (coronary artery disease)    sees Dr. Kirk Ruths   . Depression   . ED (erectile dysfunction)   . Gout   . Hyperlipemia   .  Hypertension   . Myocardial infarction [facility])    Dr Stanford Breed 2001  . Sleep apnea    Dr Gwenette Greet  . Vitamin B 12 deficiency     Past Surgical History:  Procedure Laterality Date  . ANGIOPLASTY  2001   stent placement  . CERVICAL LAMINECTOMY    . COLONOSCOPY  08-23-11   per Dr. Sharlett Iles, repeat in 5 yrs  . KNEE SURGERY    . strabimus     repair  . TONSILLECTOMY    . VASECTOMY REVERSAL      Social History   Socioeconomic History  . Marital status: Married    Spouse name: Not on file  . Number of children: Not on file  . Years of education: Not on file  . Highest education level: Not on file  Social Needs  . Financial resource strain: Not on file  . Food insecurity - worry: Not on file  . Food insecurity - inability: Not on file  . Transportation needs - medical: Not on file  . Transportation needs - non-medical: Not on file  Occupational History  . Not on file  Tobacco Use  . Smoking status: Former Smoker    Packs/day: 1.00    Years: 49.00    Pack years: 49.00    Types: Cigarettes    Last attempt to quit: 01/15/2008    Years since quitting: 8.9  . Smokeless tobacco: Never Used  Substance and Sexual Activity  . Alcohol  use: Yes    Alcohol/week: 1.8 - 2.4 oz    Types: 3 - 4 Standard drinks or equivalent per week    Comment: 4 beers per night   . Drug use: No  . Sexual activity: Not on file  Other Topics Concern  . Not on file  Social History Narrative   He recently retired from education (classroom, principal, and Contractor).   He lives with wife.  They have three grown children, daughter is in dermatology residency at [employer].   Highest level of education:  masters    Family History  Problem Relation Age of Onset  . Emphysema Mother   . Diabetes Mother   . COPD Mother   . Heart disease Father   . Peripheral vascular disease Father   . Hypertension Father   . Diabetes Brother   . Healthy Daughter   . Healthy Son   . Colon cancer Neg Hx   .  Stomach cancer Neg Hx     ROS: arthralgias but no fevers or chills, productive cough, hemoptysis, dysphasia, odynophagia, melena, hematochezia, dysuria, hematuria, rash, seizure activity, orthopnea, PND, pedal edema, claudication. Remaining systems are negative.  Physical Exam: Well-developed well-nourished in no acute distress.  Skin is warm and dry.  HEENT is normal.  Neck is supple.  Chest is clear to auscultation with normal expansion.  Cardiovascular exam is regular rate and rhythm.  Abdominal exam nontender or distended. No masses palpated. Extremities show no edema. neuro grossly intact  ECG- sinus rhythm at a rate of 68. Inferior infarct.personally reviewed  A/P  1 coronary artery disease-patient continues to do well with no chest pain or significant dyspnea. Continue medical therapy including aspirin and statin. Add Toprol 25 mg daily.  2 hypertension-blood pressure is controlled. Continue present medications.  3 hyperlipidemia-continue statin. Lipids and liver monitored by primary care.  Kirk Ruths, MD

## 2016-12-25 ENCOUNTER — Encounter: Payer: Self-pay | Admitting: Cardiology

## 2016-12-27 ENCOUNTER — Encounter: Payer: Self-pay | Admitting: Cardiology

## 2016-12-27 ENCOUNTER — Ambulatory Visit (INDEPENDENT_AMBULATORY_CARE_PROVIDER_SITE_OTHER): Payer: Medicare Other | Admitting: Cardiology

## 2016-12-27 VITALS — BP 120/66 | HR 68 | Ht 65.0 in | Wt 192.0 lb

## 2016-12-27 DIAGNOSIS — I252 Old myocardial infarction: Secondary | ICD-10-CM

## 2016-12-27 DIAGNOSIS — I2510 Atherosclerotic heart disease of native coronary artery without angina pectoris: Secondary | ICD-10-CM | POA: Diagnosis not present

## 2016-12-27 DIAGNOSIS — I10 Essential (primary) hypertension: Secondary | ICD-10-CM | POA: Diagnosis not present

## 2016-12-27 MED ORDER — METOPROLOL SUCCINATE ER 25 MG PO TB24: 25.0000 mg | ORAL_TABLET | Freq: Every day | ORAL | 6 refills | Status: DC

## 2016-12-27 NOTE — Patient Instructions (Signed)
Start Toprol XL 25 mg daily    Your physician wants you to follow-up in 1 year. You will receive a reminder letter in the mail two months in advance. If you don't receive a letter, please call our office to schedule the follow-up appointment.

## 2017-01-11 ENCOUNTER — Other Ambulatory Visit: Payer: Self-pay | Admitting: Cardiology

## 2017-01-11 DIAGNOSIS — I2510 Atherosclerotic heart disease of native coronary artery without angina pectoris: Secondary | ICD-10-CM

## 2017-02-04 ENCOUNTER — Other Ambulatory Visit: Payer: Self-pay | Admitting: Cardiology

## 2017-02-04 ENCOUNTER — Other Ambulatory Visit: Payer: Self-pay | Admitting: Family Medicine

## 2017-02-04 NOTE — Telephone Encounter (Signed)
l left message to the pharmacist to call office to confirm dosage of lisinopril.

## 2017-02-04 NOTE — Telephone Encounter (Signed)
Last OV 12/20/2016   Last refilled 11/04/2016 disp 90 with no refills

## 2017-02-13 DIAGNOSIS — M5136 Other intervertebral disc degeneration, lumbar region: Secondary | ICD-10-CM | POA: Insufficient documentation

## 2017-02-13 DIAGNOSIS — M7060 Trochanteric bursitis, unspecified hip: Secondary | ICD-10-CM | POA: Insufficient documentation

## 2017-02-13 DIAGNOSIS — M7061 Trochanteric bursitis, right hip: Secondary | ICD-10-CM | POA: Diagnosis not present

## 2017-03-07 ENCOUNTER — Ambulatory Visit (INDEPENDENT_AMBULATORY_CARE_PROVIDER_SITE_OTHER): Payer: Medicare Other

## 2017-03-07 ENCOUNTER — Ambulatory Visit (INDEPENDENT_AMBULATORY_CARE_PROVIDER_SITE_OTHER): Payer: Medicare Other | Admitting: Family Medicine

## 2017-03-07 ENCOUNTER — Encounter: Payer: Self-pay | Admitting: Family Medicine

## 2017-03-07 VITALS — BP 128/78 | Ht 64.5 in | Wt 192.0 lb

## 2017-03-07 VITALS — BP 128/78 | HR 62 | Temp 97.9°F | Ht 64.5 in | Wt 192.6 lb

## 2017-03-07 DIAGNOSIS — I10 Essential (primary) hypertension: Secondary | ICD-10-CM | POA: Diagnosis not present

## 2017-03-07 DIAGNOSIS — Z0000 Encounter for general adult medical examination without abnormal findings: Secondary | ICD-10-CM

## 2017-03-07 DIAGNOSIS — E538 Deficiency of other specified B group vitamins: Secondary | ICD-10-CM | POA: Diagnosis not present

## 2017-03-07 DIAGNOSIS — R911 Solitary pulmonary nodule: Secondary | ICD-10-CM

## 2017-03-07 DIAGNOSIS — N401 Enlarged prostate with lower urinary tract symptoms: Secondary | ICD-10-CM | POA: Diagnosis not present

## 2017-03-07 DIAGNOSIS — R918 Other nonspecific abnormal finding of lung field: Secondary | ICD-10-CM

## 2017-03-07 DIAGNOSIS — E785 Hyperlipidemia, unspecified: Secondary | ICD-10-CM | POA: Diagnosis not present

## 2017-03-07 DIAGNOSIS — I2510 Atherosclerotic heart disease of native coronary artery without angina pectoris: Secondary | ICD-10-CM

## 2017-03-07 DIAGNOSIS — N138 Other obstructive and reflux uropathy: Secondary | ICD-10-CM

## 2017-03-07 DIAGNOSIS — R739 Hyperglycemia, unspecified: Secondary | ICD-10-CM

## 2017-03-07 LAB — CBC WITH DIFFERENTIAL/PLATELET
BASOS PCT: 1.1 % (ref 0.0–3.0)
Basophils Absolute: 0.1 10*3/uL (ref 0.0–0.1)
EOS PCT: 3.7 % (ref 0.0–5.0)
Eosinophils Absolute: 0.3 10*3/uL (ref 0.0–0.7)
HEMATOCRIT: 39.8 % (ref 39.0–52.0)
HEMOGLOBIN: 14 g/dL (ref 13.0–17.0)
Lymphocytes Relative: 15.5 % (ref 12.0–46.0)
Lymphs Abs: 1.1 10*3/uL (ref 0.7–4.0)
MCHC: 35.2 g/dL (ref 30.0–36.0)
MCV: 93 fl (ref 78.0–100.0)
MONO ABS: 0.8 10*3/uL (ref 0.1–1.0)
MONOS PCT: 11.1 % (ref 3.0–12.0)
Neutro Abs: 5.1 10*3/uL (ref 1.4–7.7)
Neutrophils Relative %: 68.6 % (ref 43.0–77.0)
Platelets: 135 10*3/uL — ABNORMAL LOW (ref 150.0–400.0)
RBC: 4.28 Mil/uL (ref 4.22–5.81)
RDW: 13.6 % (ref 11.5–15.5)
WBC: 7.4 10*3/uL (ref 4.0–10.5)

## 2017-03-07 LAB — BASIC METABOLIC PANEL
BUN: 15 mg/dL (ref 6–23)
CHLORIDE: 98 meq/L (ref 96–112)
CO2: 28 mEq/L (ref 19–32)
Calcium: 9.4 mg/dL (ref 8.4–10.5)
Creatinine, Ser: 1.19 mg/dL (ref 0.40–1.50)
GFR: 64.19 mL/min (ref >60.00)
Glucose, Bld: 115 mg/dL — ABNORMAL HIGH (ref 70–99)
POTASSIUM: 5.1 meq/L (ref 3.5–5.1)
Sodium: 135 mEq/L (ref 135–145)

## 2017-03-07 LAB — HEPATIC FUNCTION PANEL
ALK PHOS: 71 U/L (ref 39–117)
ALT: 24 U/L (ref 0–53)
AST: 20 U/L (ref 0–37)
Albumin: 4.3 g/dL (ref 3.5–5.2)
BILIRUBIN DIRECT: 0.2 mg/dL (ref 0.0–0.3)
BILIRUBIN TOTAL: 1 mg/dL (ref 0.2–1.2)
TOTAL PROTEIN: 6.7 g/dL (ref 6.0–8.3)

## 2017-03-07 LAB — LIPID PANEL
CHOL/HDL RATIO: 4
Cholesterol: 158 mg/dL (ref 0–200)
HDL: 41.3 mg/dL (ref >39.00)
NONHDL: 116.42
Triglycerides: 209 mg/dL — ABNORMAL HIGH (ref 0.0–149.0)
VLDL: 41.8 mg/dL — ABNORMAL HIGH (ref 0.0–40.0)

## 2017-03-07 LAB — HEMOGLOBIN A1C: HEMOGLOBIN A1C: 6 % (ref 4.6–6.5)

## 2017-03-07 LAB — POC URINALSYSI DIPSTICK (AUTOMATED)
Bilirubin, UA: NEGATIVE
Blood, UA: NEGATIVE
GLUCOSE UA: NEGATIVE
Ketones, UA: NEGATIVE
LEUKOCYTES UA: NEGATIVE
Nitrite, UA: NEGATIVE
Protein, UA: NEGATIVE
SPEC GRAV UA: 1.015 (ref 1.010–1.025)
Urobilinogen, UA: 0.2 E.U./dL
pH, UA: 7 (ref 5.0–8.0)

## 2017-03-07 LAB — PSA: PSA: 3.79 ng/mL (ref 0.10–4.00)

## 2017-03-07 LAB — VITAMIN B12: Vitamin B-12: 441 pg/mL (ref 211–911)

## 2017-03-07 LAB — TSH: TSH: 1.16 u[IU]/mL (ref 0.35–4.50)

## 2017-03-07 LAB — LDL CHOLESTEROL, DIRECT: Direct LDL: 84 mg/dL

## 2017-03-07 MED ORDER — ATORVASTATIN CALCIUM 80 MG PO TABS: 80.0000 mg | ORAL_TABLET | Freq: Every day | ORAL | 3 refills | Status: DC

## 2017-03-07 MED ORDER — TAMSULOSIN HCL 0.4 MG PO CAPS: 0.4000 mg | ORAL_CAPSULE | Freq: Every day | ORAL | 3 refills | Status: DC

## 2017-03-07 MED ORDER — LISINOPRIL-HYDROCHLOROTHIAZIDE 20-25 MG PO TABS: 1.0000 | ORAL_TABLET | Freq: Every day | ORAL | 3 refills | Status: DC

## 2017-03-07 MED ORDER — LISINOPRIL 20 MG PO TABS: 20.0000 mg | ORAL_TABLET | Freq: Every day | ORAL | 3 refills | Status: DC

## 2017-03-07 NOTE — Progress Notes (Signed)
   Subjective:    Patient ID: Mitchell [name], male    DOB: 1945/06/17, 72 y.o.   MRN: [id number]  HPI Here to follow up on issues. He feels well. His only concern is frequent urinations with a slow stream. He has nocturia times 2-3. No discomfort. He has tried saw palmetto OTC with no benefit. He sees Dr. Stanford Breed once a year. In December he was started on Metoprolol but he stopped this after a month due to causing fatigue. We have been following a 2 mm nodule in the RUL of the lungs. He has had 2 CT scan so far showing stability, and it was recommended that we follow this for a 2 year period.    Review of Systems  Constitutional: Negative.   HENT: Negative.   Eyes: Negative.   Respiratory: Negative.   Cardiovascular: Negative.   Gastrointestinal: Negative.   Genitourinary: Positive for difficulty urinating and frequency. Negative for dysuria, hematuria and urgency.  Musculoskeletal: Negative.   Skin: Negative.   Neurological: Negative.   Psychiatric/Behavioral: Negative.        Objective:   Physical Exam  Constitutional: He is oriented to person, place, and time. He appears well-developed and well-nourished. No distress.  HENT:  Head: Normocephalic and atraumatic.  Right Ear: External ear normal.  Left Ear: External ear normal.  Nose: Nose normal.  Mouth/Throat: Oropharynx is clear and moist. No oropharyngeal exudate.  Eyes: Conjunctivae and EOM are normal. Pupils are equal, round, and reactive to light. Right eye exhibits no discharge. Left eye exhibits no discharge. No scleral icterus.  Neck: Neck supple. No JVD present. No tracheal deviation present. No thyromegaly present.  Cardiovascular: Normal rate, regular rhythm, normal heart sounds and intact distal pulses. Exam reveals no gallop and no friction rub.  No murmur heard. Pulmonary/Chest: Effort normal and breath sounds normal. No respiratory distress. He has no wheezes. He has no rales. He exhibits no tenderness.  Abdominal:  Soft. Bowel sounds are normal. He exhibits no distension and no mass. There is no tenderness. There is no rebound and no guarding.  Genitourinary: Rectum normal and penis normal. Rectal exam shows guaiac negative stool. No penile tenderness.  Genitourinary Comments: Prostate is moderately enlarged   Musculoskeletal: Normal range of motion. He exhibits no edema or tenderness.  Lymphadenopathy:    He has no cervical adenopathy.  Neurological: He is alert and oriented to person, place, and time. He has normal reflexes. No cranial nerve deficit. He exhibits normal muscle tone. Coordination normal.  Skin: Skin is warm and dry. No rash noted. He is not diaphoretic. No erythema. No pallor.  Psychiatric: He has a normal mood and affect. His behavior is normal. Judgment and thought content normal.          Assessment & Plan:  His HTN is stable. His CAD seems to be stable. I encouraged him to notify Dr. Stanford Breed about him stopping the metoprolol. He has BPH symptoms so he will try Flomax 0.4 mg daily. Get fasting labs to check his lipids, A1c, B12, etc. We will set up another chest CT for March. Alysia Penna, MD

## 2017-03-07 NOTE — Patient Instructions (Addendum)
[name] , Thank you for taking time to come for your Medicare Wellness Visit. I appreciate your ongoing commitment to your health goals. Please review the following plan we discussed and let me know if I can assist you in the future.   Educated regarding prediabetes and numbers;  A1c ranges from 5.8 to 6.5 or fasting Blood sugar > 115 -126; (126 is diabetic)   Risk: >71yo; family hx; overweight or obese; African American; Hispanic; Latino; American [place] American; [place]; history of diabetes when pregnant; or birth to a baby weighing over 9 lbs. Being less physically active than 30 minutes; 3 times a week;   Prevention; Losing a modest 7 to 8 lbs; If over 200 lbs; 10 to 14 lbs;  Choose healthier foods; colorful veggies; fish or lean meats; drinks water Reduce portion size Start exercising; 30 minutes of fast walking x 30 minutes per day/ 60 min for weight loss   Shingrix is a vaccine for the prevention of Shingles in Adults 50 and older.  If you are on Medicare, you can request a prescription from your doctor to be filled at a pharmacy.  Please check with your benefits regarding applicable copays or out of pocket expenses.  The Shingrix is given in 2 vaccines approx 8 weeks apart. You must receive the 2nd dose prior to 6 months from receipt of the first.    Deaf & Hard of Hearing Division Services - can assist with hearing aid x 1  No reviews  [place]  [facility] [phone]  [phone]  http://clienthiadev.devcloud.acquia-sites.com/sites/default/files/hearingpedia/Guide_How_to_Buy_Hearing_Aids.pdf  [employer]     These are the goals we discussed: Goals    . Weight (lb) < 185 lb (83.9 kg)       This is a list of the screening recommended for you and due dates:  Health Maintenance  Topic Date Due  . Tetanus Vaccine  10/21/2019  . Colon Cancer Screening  09/05/2021  . Flu Shot  Completed  .  Hepatitis C: One time screening is  recommended by Center for Disease Control  (CDC) for  adults born from [birth year] through 1964.   Completed  . Pneumonia vaccines  Completed    DASH Eating Plan DASH stands for "Dietary Approaches to Stop Hypertension." The DASH eating plan is a healthy eating plan that has been shown to reduce high blood pressure (hypertension). It may also reduce your risk for type 2 diabetes, heart disease, and stroke. The DASH eating plan may also help with weight loss. What are tips for following this plan? General guidelines  Avoid eating more than 2,300 mg (milligrams) of salt (sodium) a day. If you have hypertension, you may need to reduce your sodium intake to 1,500 mg a day.  Limit alcohol intake to no more than 1 drink a day for nonpregnant women and 2 drinks a day for men. One drink equals 12 oz of beer, 5 oz of wine, or 1 oz of hard liquor.  Work with your health care provider to maintain a healthy body weight or to lose weight. Ask what an ideal weight is for you.  Get at least 30 minutes of exercise that causes your heart to beat faster (aerobic exercise) most days of the week. Activities may include walking, swimming, or biking.  Work with your health care provider or diet and nutrition specialist (dietitian) to adjust your eating plan to your individual calorie needs. Reading food labels  Check food labels for the amount  of sodium per serving. Choose foods with less than 5 percent of the Daily Value of sodium. Generally, foods with less than 300 mg of sodium per serving fit into this eating plan.  To find whole grains, look for the word "whole" as the first word in the ingredient list. Shopping  Buy products labeled as "low-sodium" or "no salt added."  Buy fresh foods. Avoid canned foods and premade or frozen meals. Cooking  Avoid adding salt when cooking. Use salt-free seasonings or herbs instead of table salt or sea salt. Check with your health care provider or pharmacist before using salt  substitutes.  Do not fry foods. Cook foods using healthy methods such as baking, boiling, grilling, and broiling instead.  Cook with heart-healthy oils, such as olive, canola, soybean, or sunflower oil. Meal planning   Eat a balanced diet that includes: ? 5 or more servings of fruits and vegetables each day. At each meal, try to fill half of your plate with fruits and vegetables. ? Up to 6-8 servings of whole grains each day. ? Less than 6 oz of lean meat, poultry, or fish each day. A 3-oz serving of meat is about the same size as a deck of cards. One egg equals 1 oz. ? 2 servings of low-fat dairy each day. ? A serving of nuts, seeds, or beans 5 times each week. ? Heart-healthy fats. Healthy fats called Omega-3 fatty acids are found in foods such as flaxseeds and coldwater fish, like sardines, salmon, and mackerel.  Limit how much you eat of the following: ? Canned or prepackaged foods. ? Food that is high in trans fat, such as fried foods. ? Food that is high in saturated fat, such as fatty meat. ? Sweets, desserts, sugary drinks, and other foods with added sugar. ? Full-fat dairy products.  Do not salt foods before eating.  Try to eat at least 2 vegetarian meals each week.  Eat more home-cooked food and less restaurant, buffet, and fast food.  When eating at a restaurant, ask that your food be prepared with less salt or no salt, if possible. What foods are recommended? The items listed may not be a complete list. Talk with your dietitian about what dietary choices are best for you. Grains Whole-grain or whole-wheat bread. Whole-grain or whole-wheat pasta. Brown rice. [place]. Bulgur. Whole-grain and low-sodium cereals. Pita bread. Low-fat, low-sodium crackers. Whole-wheat flour tortillas. Vegetables Fresh or frozen vegetables (raw, steamed, roasted, or grilled). Low-sodium or reduced-sodium tomato and vegetable juice. Low-sodium or reduced-sodium tomato sauce and tomato  paste. Low-sodium or reduced-sodium canned vegetables. Fruits All fresh, dried, or frozen fruit. Canned fruit in natural juice (without added sugar). Meat and other protein foods Skinless chicken or [place]. Ground chicken or [place]. Pork with fat trimmed off. Fish and seafood. Egg whites. Dried beans, peas, or lentils. Unsalted nuts, nut butters, and seeds. Unsalted canned beans. Lean cuts of beef with fat trimmed off. Low-sodium, lean deli meat. Dairy Low-fat (1%) or fat-free (skim) milk. Fat-free, low-fat, or reduced-fat cheeses. Nonfat, low-sodium ricotta or cottage cheese. Low-fat or nonfat yogurt. Low-fat, low-sodium cheese. Fats and oils Soft margarine without trans fats. Vegetable oil. Low-fat, reduced-fat, or light mayonnaise and salad dressings (reduced-sodium). Canola, safflower, olive, soybean, and sunflower oils. Avocado. Seasoning and other foods Herbs. Spices. Seasoning mixes without salt. Unsalted popcorn and pretzels. Fat-free sweets. What foods are not recommended? The items listed may not be a complete list. Talk with your dietitian about what dietary choices are best  for you. Grains Baked goods made with fat, such as croissants, muffins, or some breads. Dry pasta or rice meal packs. Vegetables Creamed or fried vegetables. Vegetables in a cheese sauce. Regular canned vegetables (not low-sodium or reduced-sodium). Regular canned tomato sauce and paste (not low-sodium or reduced-sodium). Regular tomato and vegetable juice (not low-sodium or reduced-sodium). Angie Fava. Olives. Fruits Canned fruit in a light or heavy syrup. Fried fruit. Fruit in cream or butter sauce. Meat and other protein foods Fatty cuts of meat. Ribs. Fried meat. Berniece Salines. Sausage. Bologna and other processed lunch meats. Salami. Fatback. Hotdogs. Bratwurst. Salted nuts and seeds. Canned beans with added salt. Canned or smoked fish. Whole eggs or egg yolks. Chicken or [place] with skin. Dairy Whole or 2% milk,  cream, and half-and-half. Whole or full-fat cream cheese. Whole-fat or sweetened yogurt. Full-fat cheese. Nondairy creamers. Whipped toppings. Processed cheese and cheese spreads. Fats and oils Butter. Stick margarine. Lard. Shortening. Ghee. Bacon fat. Tropical oils, such as coconut, palm kernel, or palm oil. Seasoning and other foods Salted popcorn and pretzels. Onion salt, garlic salt, seasoned salt, table salt, and sea salt. Worcestershire sauce. Tartar sauce. Barbecue sauce. Teriyaki sauce. Soy sauce, including reduced-sodium. Steak sauce. Canned and packaged gravies. Fish sauce. Oyster sauce. Cocktail sauce. Horseradish that you find on the shelf. Ketchup. Mustard. Meat flavorings and tenderizers. Bouillon cubes. Hot sauce and Tabasco sauce. Premade or packaged marinades. Premade or packaged taco seasonings. Relishes. Regular salad dressings. Where to find more information:  National Heart, Lung, and [place]: https://wilson-eaton.com/  American Heart Association: www.heart.org Summary  The DASH eating plan is a healthy eating plan that has been shown to reduce high blood pressure (hypertension). It may also reduce your risk for type 2 diabetes, heart disease, and stroke.  With the DASH eating plan, you should limit salt (sodium) intake to 2,300 mg a day. If you have hypertension, you may need to reduce your sodium intake to 1,500 mg a day.  When on the DASH eating plan, aim to eat more fresh fruits and vegetables, whole grains, lean proteins, low-fat dairy, and heart-healthy fats.  Work with your health care provider or diet and nutrition specialist (dietitian) to adjust your eating plan to your individual calorie needs. This information is not intended to replace advice given to you by your health care provider. Make sure you discuss any questions you have with your health care provider. Document Released: 12/20/2010 Document Revised: 12/25/2015 Document Reviewed: 12/25/2015 Elsevier  Interactive Patient Education  2018 [place] in the Home Falls can cause injuries. They can happen to people of all ages. There are many things you can do to make your home safe and to help prevent falls. What can I do on the outside of my home?  Regularly fix the edges of walkways and driveways and fix any cracks.  Remove anything that might make you trip as you walk through a door, such as a raised step or threshold.  Trim any bushes or trees on the path to your home.  Use bright outdoor lighting.  Clear any walking paths of anything that might make someone trip, such as rocks or tools.  Regularly check to see if handrails are loose or broken. Make sure that both sides of any steps have handrails.  Any raised decks and porches should have guardrails on the edges.  Have any leaves, snow, or ice cleared regularly.  Use sand or salt on walking paths during winter.  Clean up  any spills in your garage right away. This includes oil or grease spills. What can I do in the bathroom?  Use night lights.  Install grab bars by the toilet and in the tub and shower. Do not use towel bars as grab bars.  Use non-skid mats or decals in the tub or shower.  If you need to sit down in the shower, use a plastic, non-slip stool.  Keep the floor dry. Clean up any water that spills on the floor as soon as it happens.  Remove soap buildup in the tub or shower regularly.  Attach bath mats securely with double-sided non-slip rug tape.  Do not have throw rugs and other things on the floor that can make you trip. What can I do in the bedroom?  Use night lights.  Make sure that you have a light by your bed that is easy to reach.  Do not use any sheets or blankets that are too big for your bed. They should not hang down onto the floor.  Have a firm chair that has side arms. You can use this for support while you get dressed.  Do not have throw rugs and other things on  the floor that can make you trip. What can I do in the kitchen?  Clean up any spills right away.  Avoid walking on wet floors.  Keep items that you use a lot in easy-to-reach places.  If you need to reach something above you, use a strong step stool that has a grab bar.  Keep electrical cords out of the way.  Do not use floor polish or wax that makes floors slippery. If you must use wax, use non-skid floor wax.  Do not have throw rugs and other things on the floor that can make you trip. What can I do with my stairs?  Do not leave any items on the stairs.  Make sure that there are handrails on both sides of the stairs and use them. Fix handrails that are broken or loose. Make sure that handrails are as long as the stairways.  Check any carpeting to make sure that it is firmly attached to the stairs. Fix any carpet that is loose or worn.  Avoid having throw rugs at the top or bottom of the stairs. If you do have throw rugs, attach them to the floor with carpet tape.  Make sure that you have a light switch at the top of the stairs and the bottom of the stairs. If you do not have them, ask someone to add them for you. What else can I do to help prevent falls?  Wear shoes that: ? Do not have high heels. ? Have rubber bottoms. ? Are comfortable and fit you well. ? Are closed at the toe. Do not wear sandals.  If you use a stepladder: ? Make sure that it is fully opened. Do not climb a closed stepladder. ? Make sure that both sides of the stepladder are locked into place. ? Ask someone to hold it for you, if possible.  Clearly mark and make sure that you can see: ? Any grab bars or handrails. ? First and last steps. ? Where the edge of each step is.  Use tools that help you move around (mobility aids) if they are needed. These include: ? Canes. ? Walkers. ? Scooters. ? Crutches.  Turn on the lights when you go into a dark area. Replace any light bulbs as soon as they burn  out.  Set up your furniture so you have a clear path. Avoid moving your furniture around.  If any of your floors are uneven, fix them.  If there are any pets around you, be aware of where they are.  Review your medicines with your doctor. Some medicines can make you feel dizzy. This can increase your chance of falling. Ask your doctor what other things that you can do to help prevent falls. This information is not intended to replace advice given to you by your health care provider. Make sure you discuss any questions you have with your health care provider. Document Released: 10/27/2008 Document Revised: 06/08/2015 Document Reviewed: 02/04/2014 Elsevier Interactive Patient Education  2018 [place] Maintenance, Male A healthy lifestyle and preventive care is important for your health and wellness. Ask your health care provider about what schedule of regular examinations is right for you. What should I know about weight and diet? Eat a Healthy Diet  Eat plenty of vegetables, fruits, whole grains, low-fat dairy products, and lean protein.  Do not eat a lot of foods high in solid fats, added sugars, or salt.  Maintain a Healthy Weight Regular exercise can help you achieve or maintain a healthy weight. You should:  Do at least 150 minutes of exercise each week. The exercise should increase your heart rate and make you sweat (moderate-intensity exercise).  Do strength-training exercises at least twice a week.  Watch Your Levels of Cholesterol and Blood Lipids  Have your blood tested for lipids and cholesterol every 5 years starting at 71 years of age. If you are at high risk for heart disease, you should start having your blood tested when you are 71 years old. You may need to have your cholesterol levels checked more often if: ? Your lipid or cholesterol levels are high. ? You are older than 71 years of age. ? You are at high risk for heart disease.  What should I  know about cancer screening? Many types of cancers can be detected early and may often be prevented. Lung Cancer  You should be screened every year for lung cancer if: ? You are a current smoker who has smoked for at least 30 years. ? You are a former smoker who has quit within the past 15 years.  Talk to your health care provider about your screening options, when you should start screening, and how often you should be screened.  Colorectal Cancer  Routine colorectal cancer screening usually begins at 71 years of age and should be repeated every 5-10 years until you are 71 years old. You may need to be screened more often if early forms of precancerous polyps or small growths are found. Your health care provider may recommend screening at an earlier age if you have risk factors for colon cancer.  Your health care provider may recommend using home test kits to check for hidden blood in the stool.  A small camera at the end of a tube can be used to examine your colon (sigmoidoscopy or colonoscopy). This checks for the earliest forms of colorectal cancer.  Prostate and Testicular Cancer  Depending on your age and overall health, your health care provider may do certain tests to screen for prostate and testicular cancer.  Talk to your health care provider about any symptoms or concerns you have about testicular or prostate cancer.  Skin Cancer  Check your skin from head to toe regularly.  Tell your health care provider about  any new moles or changes in moles, especially if: ? There is a change in a mole's size, shape, or color. ? You have a mole that is larger than a pencil eraser.  Always use sunscreen. Apply sunscreen liberally and repeat throughout the day.  Protect yourself by wearing long sleeves, pants, a wide-brimmed hat, and sunglasses when outside.  What should I know about heart disease, diabetes, and high blood pressure?  If you are 14-100 years of age, have your blood  pressure checked every 3-5 years. If you are 26 years of age or older, have your blood pressure checked every year. You should have your blood pressure measured twice-once when you are at a hospital or clinic, and once when you are not at a hospital or clinic. Record the average of the two measurements. To check your blood pressure when you are not at a hospital or clinic, you can use: ? An automated blood pressure machine at a pharmacy. ? A home blood pressure monitor.  Talk to your health care provider about your target blood pressure.  If you are between 31-77 years old, ask your health care provider if you should take aspirin to prevent heart disease.  Have regular diabetes screenings by checking your fasting blood sugar level. ? If you are at a normal weight and have a low risk for diabetes, have this test once every three years after the age of 32. ? If you are overweight and have a high risk for diabetes, consider being tested at a younger age or more often.  A one-time screening for abdominal aortic aneurysm (AAA) by ultrasound is recommended for men aged 73-75 years who are current or former smokers. What should I know about preventing infection? Hepatitis B If you have a higher risk for hepatitis B, you should be screened for this virus. Talk with your health care provider to find out if you are at risk for hepatitis B infection. Hepatitis C Blood testing is recommended for:  Everyone born from [birth year] through 1964.  Anyone with known risk factors for hepatitis C.  Sexually Transmitted Diseases (STDs)  You should be screened each year for STDs including gonorrhea and chlamydia if: ? You are sexually active and are younger than 71 years of age. ? You are older than 71 years of age and your health care provider tells you that you are at risk for this type of infection. ? Your sexual activity has changed since you were last screened and you are at an increased risk for chlamydia or  gonorrhea. Ask your health care provider if you are at risk.  Talk with your health care provider about whether you are at high risk of being infected with HIV. Your health care provider may recommend a prescription medicine to help prevent HIV infection.  What else can I do?  Schedule regular health, dental, and eye exams.  Stay current with your vaccines (immunizations).  Do not use any tobacco products, such as cigarettes, chewing tobacco, and e-cigarettes. If you need help quitting, ask your health care provider.  Limit alcohol intake to no more than 2 drinks per day. One drink equals 12 ounces of beer, 5 ounces of wine, or 1 ounces of hard liquor.  Do not use street drugs.  Do not share needles.  Ask your health care provider for help if you need support or information about quitting drugs.  Tell your health care provider if you often feel depressed.  Tell your health care provider  if you have ever been abused or do not feel safe at home. This information is not intended to replace advice given to you by your health care provider. Make sure you discuss any questions you have with your health care provider. Document Released: 06/29/2007 Document Revised: 08/30/2015 Document Reviewed: 10/04/2014 Elsevier Interactive Patient Education  [employer].

## 2017-03-07 NOTE — Progress Notes (Addendum)
Subjective:   [name] is a 72 y.o. male who presents for Medicare Annual/Initial preventive examination.  Reports health as good Some pain due to disk and was on prednisone and needs to go back; 1- 10     Diet Lipids chol/hdl 5; chol 152; hdl 32; ldl 82; trig 287  Glucose 109 and A1c 5.9 in 2013  BMI 32  3 meals; lives with wife; both cook Tries to get vegetable  Like green beans; broccoli; peas Wife cooks well    Exercise  Hits the gym 3 to 4 times a week [place] on the treadmill  Recommended 5 - 30 minutes periods a week  People are fussing at you about our liver   There are no preventive care reminders to display for this patient.  Colonoscopy 09/05/2016  PSA 02/2016 Mitchell med today for urinary issues   Educated regarding the shingrix Zoster 09/2015   ETOH 4 beers per night   Tobacco; Former smoker; 49 pack years Quit 9 years ago Had CT to fup on node 03/2016; in the LDCT program  AAA 01/12/15      Objective:    Vitals: BP 128/78   Ht 5' 4.5" (1.638 m)   Wt 192 lb (87.1 kg)   BMI 32.45 kg/m   Body mass index is 32.45 kg/m.  Advanced Directives 03/07/2017 03/07/2017 08/26/2016  Does Patient Have a Medical Advance Directive? Yes Yes Yes  Type of Advance Directive - - [name];Living will  Copy of [name] in Chart? - - No - copy requested    Completed  Tobacco Social History   Tobacco Use  Smoking Status Former Smoker  . Packs/day: 1.00  . Years: 49.00  . Pack years: 49.00  . Types: Cigarettes  . Last attempt to quit: 01/15/2008  . Years since quitting: 9.1  Smokeless Tobacco Never Used     Counseling given: Not Answered   Clinical Intake:     Past Medical History:  Diagnosis Date  . CAD (coronary artery disease)    sees Dr. Kirk [name]   . Depression   . ED (erectile dysfunction)   . Gout   . Hyperlipemia   . Hypertension   . Myocardial infarction [name])    Dr [name] 2001  . Sleep apnea    Dr [name]  . Vitamin B 12 deficiency    Past Surgical History:  Procedure Laterality Date  . ANGIOPLASTY  2001   stent placement  . CERVICAL LAMINECTOMY    . COLONOSCOPY  09/05/2016   per Dr. Loletha [name], adenomatous polyp, repeat in 5 yrs  . KNEE SURGERY    . strabimus     repair  . TONSILLECTOMY    . VASECTOMY REVERSAL     Family History  Problem Relation Age of Onset  . Emphysema Mother   . Diabetes Mother   . COPD Mother   . Heart disease Father   . Peripheral vascular disease Father   . Hypertension Father   . Diabetes Brother   . Healthy Daughter   . Healthy Son   . Colon cancer Neg Hx   . Stomach cancer Neg Hx    Social History   Socioeconomic History  . Marital status: Married    Spouse name: Not on file  . Number of children: Not on file  . Years of education: Not on file  . Highest education level: Not on file  Social Needs  . Financial resource strain: Not  on file  . Food insecurity - worry: Not on file  . Food insecurity - inability: Not on file  . Transportation needs - medical: Not on file  . Transportation needs - non-medical: Not on file  Occupational History  . Not on file  Tobacco Use  . Smoking status: Former Smoker    Packs/day: 1.00    Years: 49.00    Pack years: 49.00    Types: Cigarettes    Last attempt to quit: 01/15/2008    Years since quitting: 9.1  . Smokeless tobacco: Never Used  Substance and Sexual Activity  . Alcohol use: Yes    Alcohol/week: 1.8 - 2.4 oz    Types: 3 - 4 Standard drinks or equivalent per week    Comment: 4 beers per night   . Drug use: No  . Sexual activity: Not on file  Other Topics Concern  . Not on file  Social History Narrative   He recently retired from education (classroom, principal, and Contractor).   He lives with wife.  They have three grown children, daughter is in dermatology residency at [name].   Highest level of education:  masters    Outpatient Encounter Medications as of 03/07/2017    Medication Sig  . aspirin 81 MG tablet Take 81 mg by mouth daily.  Marland Kitchen atorvastatin (LIPITOR) 80 MG tablet Take 1 tablet (80 mg total) by mouth daily.  Marland Kitchen lisinopril (PRINIVIL,ZESTRIL) 20 MG tablet Take 1 tablet (20 mg total) by mouth daily.  Marland Kitchen lisinopril-hydrochlorothiazide (PRINZIDE,ZESTORETIC) 20-25 MG tablet Take 1 tablet by mouth daily.  . Omega-3 Fatty Acids ([employer] FISH OIL) 1000 MG CAPS Take 1 capsule by mouth daily. (Patient taking differently: Take 1 capsule by mouth daily. 1400 mg)  . PRESCRIPTION MEDICATION Glaucoma eye gtts  . triamcinolone cream (KENALOG) 0.1 % Apply 1 application topically 3 (three) times daily.  . [DISCONTINUED] atorvastatin (LIPITOR) 80 MG tablet TAKE ONE TABLET BY MOUTH DAILY  . [DISCONTINUED] diclofenac (VOLTAREN) 75 MG EC tablet Take 1 tablet (75 mg total) by mouth 2 (two) times daily.  . [DISCONTINUED] lisinopril (PRINIVIL,ZESTRIL) 20 MG tablet Take 20 mg by mouth daily.  . [DISCONTINUED] lisinopril (PRINIVIL,ZESTRIL) 20 MG tablet TAKE ONE TABLET BY MOUTH DAILY  . [DISCONTINUED] lisinopril-hydrochlorothiazide (PRINZIDE,ZESTORETIC) 20-25 MG tablet TAKE 1 TABLET BY MOUTH DAILY.  . [DISCONTINUED] metoprolol succinate (TOPROL XL) 25 MG 24 hr tablet Take 1 tablet (25 mg total) by mouth daily. (Patient not taking: Reported on 03/07/2017)  . [DISCONTINUED] 0.9 %  sodium chloride infusion    No facility-administered encounter medications on file as of 03/07/2017.     Activities of Daily Living In your present state of health, do you have any difficulty performing the following activities: 03/07/2017  Hearing? N  Vision? N  Difficulty concentrating or making decisions? N  Walking or climbing stairs? N  Dressing or bathing? N  Doing errands, shopping? N  Preparing Food and eating ? N  Using the Toilet? N  In the past six months, have you accidently leaked urine? Y  Do you have problems with loss of bowel control? N  Managing your Medications? N  Managing your  Finances? N  Housekeeping or managing your Housekeeping? N  Some recent data might be hidden    Patient Care Team: Mitchell Morale, MD as PCP - General   Assessment:   This is a routine wellness examination for [name].  Exercise Activities and Dietary recommendations Current Exercise Habits: Structured exercise class, Type  of exercise: strength training/weights;walking, Exercise limited by: orthopedic condition(s)  Goals    . Weight (lb) < 185 lb (83.9 kg)     Check out  online nutrition programs as GumSearch.nl and http://vang.com/; fit75me; Or calorieking.com Look for foods with "whole" wheat; bran; oatmeal etc Shot at the farmer's markets in season for fresher choices  Watch for "hydrogenated" on the label of oils which are trans-fats.  Watch for "high fructose corn syrup" in snacks, yogurt or ketchup  Meats have less marbling; bright colored fruits and vegetables;  Canned; dump out liquid and wash vegetables. Be mindful of what we are eating  Portion control is essential to a health weight! Sit down; take a break and enjoy your meal; take smaller bites; put the fork down between bites;  It takes 20 minutes to get full; so check in with your fullness cues and stop eating when you start to fill full              Fall Risk Fall Risk  03/07/2017 02/09/2015 02/21/2014  Falls in the past year? No No No     Depression Screen PHQ 2/9 Scores 03/07/2017 03/07/2017 02/09/2015  PHQ - 2 Score 0 0 0    Cognitive Function Ad8 score reviewed for issues:  Issues making decisions:  Less interest in hobbies / activities:  Repeats questions, stories (family complaining):  Trouble using ordinary gadgets (microwave, computer, phone):  Forgets the month or year:   Mismanaging finances:   Remembering appts:  Daily problems with thinking and/or memory: Ad8 score is=0 Sister 71; early onset  Does have an E gene; did an MRI MRI no lesions; Not an elevated level  Admitted to  a trial but later did not make it into phase 2;          Immunization History  Administered Date(s) Administered  . Influenza Split 10/13/2012  . Influenza Whole 10/14/2008  . Influenza, High Dose Seasonal PF 09/29/2015, 10/09/2016  . Influenza,inj,Quad PF,6+ Mos 09/21/2013  . Pneumococcal Conjugate-13 09/21/2013  . Pneumococcal Polysaccharide-23 09/29/2015  . Td 10/20/2009  . Zoster 10/10/2015      Screening Tests Health Maintenance  Topic Date Due  . TETANUS/TDAP  10/21/2019  . COLONOSCOPY  09/05/2021  . INFLUENZA VACCINE  Completed  . Hepatitis C Screening  Completed  . PNA vac Low Risk Adult  Completed       Plan:      PCP Notes   Health Maintenance Educated regarding shingrix although he just had the zoster 09/2015 Educated regarding pre-diabetes and weight loss/ exercise Educated regarding the DASH diet as he 'loves" salt    Abnormal Screens  1. BMI and A1c discussed; goal to lose 7 to 8 lbs  2. Reviewed triglyceride hx / also discussed ETOH use. Drinks daily around 9pm to 41mn. Seems to be entering a pre-contemplation to contemplation. Engages in discussion regarding his drinking although he does not feel it is a problem. Voices concerns regarding his health; takes milk thistle to protect his liver. Voices some dissonance regarding his drinking, stating " I know I shouldn't do that." and "I didn't retire to drink" etc. Overall, not ready to quit but will when he decides to do so.      Referrals  none  Patient concerns;  Discusses his overall health.   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 Medical and social history . Use of alcohol, tobacco or illicit drugs  . Current medications and supplements . Functional ability and status . Nutritional status . Physical  activity . Advanced directives . List of other physicians . Hospitalizations, surgeries, and ER visits in previous 12 months . Vitals . Screenings to include cognitive, depression, and falls . Referrals and appointments  In addition, I have reviewed and discussed with patient certain preventive protocols, quality metrics, and best practice recommendations. A written personalized care plan for preventive services as well as general preventive health recommendations were provided to patient.     [email or web address],[email or web address], RN  03/07/2017  I have read this note and agree with its contents.  Alysia Penna, MD

## 2017-03-20 ENCOUNTER — Ambulatory Visit (INDEPENDENT_AMBULATORY_CARE_PROVIDER_SITE_OTHER)
Admission: RE | Admit: 2017-03-20 | Discharge: 2017-03-20 | Disposition: A | Discharge status: Home / Self Care | Payer: Medicare Other | Source: Ambulatory Visit | Attending: Family Medicine | Admitting: Family Medicine

## 2017-03-20 DIAGNOSIS — R918 Other nonspecific abnormal finding of lung field: Secondary | ICD-10-CM | POA: Diagnosis not present

## 2017-03-20 DIAGNOSIS — R911 Solitary pulmonary nodule: Secondary | ICD-10-CM

## 2017-03-20 DIAGNOSIS — M5136 Other intervertebral disc degeneration, lumbar region: Secondary | ICD-10-CM | POA: Diagnosis not present

## 2017-03-21 ENCOUNTER — Encounter: Payer: Self-pay | Admitting: Family Medicine

## 2017-03-25 NOTE — Telephone Encounter (Signed)
These granulomas are nothing to worry about. We see these frequently on CT scans of people. They are the scarred remnants of some process that occurred in his body years ago, and they will always be there. He has no signs of an active granulomatous process in his body now however.

## 2017-03-25 NOTE — Telephone Encounter (Signed)
Noted  

## 2017-03-25 NOTE — Telephone Encounter (Signed)
His liver results are all normal

## 2017-04-10 ENCOUNTER — Other Ambulatory Visit: Payer: Self-pay | Admitting: Family Medicine

## 2017-05-10 DIAGNOSIS — M5136 Other intervertebral disc degeneration, lumbar region: Secondary | ICD-10-CM | POA: Diagnosis not present

## 2017-05-10 DIAGNOSIS — M7061 Trochanteric bursitis, right hip: Secondary | ICD-10-CM | POA: Diagnosis not present

## 2017-05-27 DIAGNOSIS — M545 Low back pain: Secondary | ICD-10-CM | POA: Diagnosis not present

## 2017-06-19 ENCOUNTER — Other Ambulatory Visit: Payer: Self-pay | Admitting: Family Medicine

## 2017-06-19 ENCOUNTER — Other Ambulatory Visit: Payer: Self-pay | Admitting: Orthopedic Surgery

## 2017-06-19 DIAGNOSIS — M5136 Other intervertebral disc degeneration, lumbar region: Secondary | ICD-10-CM

## 2017-06-20 ENCOUNTER — Telehealth: Payer: Self-pay

## 2017-06-20 DIAGNOSIS — K5900 Constipation, unspecified: Secondary | ICD-10-CM

## 2017-06-20 DIAGNOSIS — R1084 Generalized abdominal pain: Secondary | ICD-10-CM

## 2017-06-20 NOTE — Telephone Encounter (Signed)
Last OV 03/07/2017   Medication is listed on pt's medication list

## 2017-06-20 NOTE — Telephone Encounter (Signed)
Called and spoke with pt he is takes this medication to treat his GOUT.  Sent to PCP to advise

## 2017-06-20 NOTE — Telephone Encounter (Signed)
Requesting a referral to see GI doctor for abdominal pains, constipation, and frequent BM and not being able to full relieve his self.   Sent to PCP to place referral

## 2017-06-23 NOTE — Telephone Encounter (Signed)
Called and spoke with pt. Pt advised and voiced understanding. Explained to pt that referrals can take up  to 1-2 weeks before he will receive a phone call for appt.

## 2017-06-23 NOTE — Telephone Encounter (Signed)
The referral is done

## 2017-06-24 ENCOUNTER — Other Ambulatory Visit (INDEPENDENT_AMBULATORY_CARE_PROVIDER_SITE_OTHER): Payer: Medicare Other

## 2017-06-24 ENCOUNTER — Ambulatory Visit: Payer: BC Managed Care – PPO | Admitting: Gastroenterology

## 2017-06-24 ENCOUNTER — Encounter: Payer: Self-pay | Admitting: Gastroenterology

## 2017-06-24 VITALS — BP 108/60 | HR 74 | Ht 65.0 in | Wt 189.0 lb

## 2017-06-24 DIAGNOSIS — R140 Abdominal distension (gaseous): Secondary | ICD-10-CM

## 2017-06-24 DIAGNOSIS — R198 Other specified symptoms and signs involving the digestive system and abdomen: Secondary | ICD-10-CM

## 2017-06-24 DIAGNOSIS — R143 Flatulence: Secondary | ICD-10-CM

## 2017-06-24 LAB — IGA: IgA: 193 mg/dL (ref 68–378)

## 2017-06-24 MED ORDER — HYOSCYAMINE SULFATE 0.125 MG SL SUBL: 0.1250 mg | SUBLINGUAL_TABLET | Freq: Four times a day (QID) | SUBLINGUAL | 2 refills | Status: DC | PRN

## 2017-06-24 NOTE — Progress Notes (Signed)
[facility] Progress Note  Chief Complaint: Altered bowel habits  Subjective  History:  [name] is known to me from a surveillance colonoscopy in August 2018, at which time a single subcentimeter tubular adenoma was removed.  Just after that his son was admitted to [facility] hospital in fulminant hepatic failure from alcoholic hepatitis, from which she ultimately succumbed. [name] reports years of frequent gas that is quite foul-smelling, bloating, and feelings of incomplete bowel evacuation.  After bowel movement he feels that he needs to do extensive cleaning because there is always something left.  He has to go again a few more times within the next few hours.  Sometimes he wipes so much that then there is blood.  There is also occasional crampy lower abdominal pain.  He has heartburn as well, denies dysphagia, nausea, vomiting, early satiety or weight loss.  ROS: Cardiovascular:  no chest pain Respiratory: no dyspnea He is still grieving over the loss of his son has feelings of anxiety and guilt Erectile dysfunction Remainder of systems negative except as above  The patient's Past Medical, Family and Social History were reviewed and are on file in the EMR. Past Medical History:  Diagnosis Date  . CAD (coronary artery disease)    sees Dr. Kirk Ruths   . Depression   . ED (erectile dysfunction)   . Gout   . Hyperlipemia   . Hypertension   . Myocardial infarction [facility])    Dr Stanford Breed 2001  . Sleep apnea    Dr Gwenette Greet  . Vitamin B 12 deficiency    No Hx pancreatitis  Family History  Problem Relation Age of Onset  . Emphysema Mother   . Diabetes Mother   . COPD Mother   . Heart disease Father   . Peripheral vascular disease Father   . Hypertension Father   . Diabetes Brother   . Healthy Daughter   . Healthy Son   . Colon cancer Neg Hx   . Stomach cancer Neg Hx    Social history: 4-5 beers nightly  Objective:  Med list reviewed  Current  Outpatient Medications:  .  aspirin 81 MG tablet, Take 81 mg by mouth daily., Disp: , Rfl:  .  atorvastatin (LIPITOR) 80 MG tablet, Take 1 tablet (80 mg total) by mouth daily., Disp: 90 tablet, Rfl: 3 .  gabapentin (NEURONTIN) 300 MG capsule, Take 300 mg by mouth at bedtime., Disp: , Rfl:  .  indomethacin (INDOCIN) 50 MG capsule, TAKE ONE CAPSULE BY MOUTH THREE TIMES DAILY WITH MEALS., Disp: 270 capsule, Rfl: 3 .  lisinopril (PRINIVIL,ZESTRIL) 20 MG tablet, Take 1 tablet (20 mg total) by mouth daily., Disp: 90 tablet, Rfl: 3 .  lisinopril-hydrochlorothiazide (PRINZIDE,ZESTORETIC) 20-25 MG tablet, Take 1 tablet by mouth daily., Disp: 90 tablet, Rfl: 3 .  Omega-3 Fatty Acids ([employer] FISH OIL) 1000 MG CAPS, Take 1 capsule by mouth daily. (Patient taking differently: Take 1 capsule by mouth daily. 1400 mg), Disp: 1 capsule, Rfl: 0 .  PRESCRIPTION MEDICATION, Glaucoma eye gtts, Disp: , Rfl:  .  sildenafil (REVATIO) 20 MG tablet, TAKE 1 TABLET (20 MG TOTAL) BY MOUTH 5 (FIVE) TIMES DAILY AS NEEDED., Disp: 50 tablet, Rfl: 10 .  triamcinolone cream (KENALOG) 0.1 %, Apply 1 application topically 3 (three) times daily. (Patient taking differently: Apply 1 application topically 3 (three) times daily as needed. ), Disp: 45 g, Rfl: 5 .  hyoscyamine (LEVSIN SL) 0.125 MG SL tablet, Place 1 tablet (0.125  mg total) under the tongue every 6 (six) hours as needed., Disp: 30 tablet, Rfl: 2   Vital signs in last 24 hrs: Vitals:   06/24/17 1048  BP: 108/60  Pulse: 74    Physical Exam  Pleasant man, dysthymic affect  HEENT: sclera anicteric, oral mucosa moist without lesions  Neck: supple, no thyromegaly, JVD or lymphadenopathy  Cardiac: RRR without murmurs, S1S2 heard, no peripheral edema  Pulm: clear to auscultation bilaterally, normal RR and effort noted  Abdomen: soft, no tenderness, with active bowel sounds. No guarding or palpable hepatosplenomegaly.  Skin; warm and dry, no jaundice or rash  Recent  Labs:  CMP Latest Ref Rng & Units 03/07/2017 03/04/2016 12/29/2015  Glucose 70 - 99 mg/dL 115(H) 109(H) 101(H)  BUN 6 - 23 mg/dL 15 24(H) 21  Creatinine 0.40 - 1.50 mg/dL 1.19 1.16 1.20  Sodium 135 - 145 mEq/L 135 132(L) 130(L)  Potassium 3.5 - 5.1 mEq/L 5.1 4.7 5.2  Chloride 96 - 112 mEq/L 98 98 96(L)  CO2 19 - 32 mEq/L 28 27 25   Calcium 8.4 - 10.5 mg/dL 9.4 9.0 9.3  Total Protein 6.0 - 8.3 g/dL 6.7 6.7 -  Total Bilirubin 0.2 - 1.2 mg/dL 1.0 0.8 -  Alkaline Phos 39 - 117 U/L 71 69 -  AST 0 - 37 U/L 20 21 -  ALT 0 - 53 U/L 24 21 -   TSH normal and Hgb A1c 6.0 Feb 2019 No abdominal imaging for review   @ASSESSMENTPLANBEGIN @ Assessment: Encounter Diagnoses  Name Primary?  . Change in bowel function Yes  . Abdominal bloating   . Flatus    Much of this sounds like IBS, also consider gluten intolerance and exocrine pancreatic insufficiency   Plan: TTG/IgA antibody, fecal elastase Written dietary advice given regarding bloating and gas/maldigestion Trial of hyoscyamine Follow-up about 6 weeks   Total time 30 minutes, over half spent face-to-face with patient in counseling and coordination of care.   [name] III

## 2017-06-24 NOTE — Patient Instructions (Signed)
If you are age 71 or older, your body mass index should be between 23-30. Your Body mass index is 31.45 kg/m. If this is out of the aforementioned range listed, please consider follow up with your Primary Care Provider.  If you are age 48 or younger, your body mass index should be between 19-25. Your Body mass index is 31.45 kg/m. If this is out of the aformentioned range listed, please consider follow up with your Primary Care Provider.   Your provider has requested that you go to the basement level for lab work before leaving today. Press "B" on the elevator. The lab is located at the first door on the left as you exit the elevator.   It was a pleasure to meet you today!  Dr. Loletha Carrow

## 2017-06-25 ENCOUNTER — Other Ambulatory Visit: Payer: Medicare Other

## 2017-06-25 DIAGNOSIS — R140 Abdominal distension (gaseous): Secondary | ICD-10-CM | POA: Diagnosis not present

## 2017-06-25 DIAGNOSIS — R143 Flatulence: Secondary | ICD-10-CM | POA: Diagnosis not present

## 2017-06-25 DIAGNOSIS — R198 Other specified symptoms and signs involving the digestive system and abdomen: Secondary | ICD-10-CM

## 2017-06-25 LAB — TISSUE TRANSGLUTAMINASE, IGA: (tTG) Ab, IgA: 1 U/mL

## 2017-06-27 ENCOUNTER — Ambulatory Visit
Admission: RE | Admit: 2017-06-27 | Discharge: 2017-06-27 | Disposition: A | Discharge status: Home / Self Care | Payer: Medicare Other | Source: Ambulatory Visit | Attending: Orthopedic Surgery | Admitting: Orthopedic Surgery

## 2017-06-27 DIAGNOSIS — M48061 Spinal stenosis, lumbar region without neurogenic claudication: Secondary | ICD-10-CM | POA: Diagnosis not present

## 2017-06-27 DIAGNOSIS — M5136 Other intervertebral disc degeneration, lumbar region: Secondary | ICD-10-CM

## 2017-06-27 MED ORDER — IOPAMIDOL (ISOVUE-M 200) INJECTION 41%: 15.0000 mL | Freq: Once | INTRAMUSCULAR | Status: DC

## 2017-06-27 MED ORDER — DIAZEPAM 5 MG PO TABS
5.0000 mg | ORAL_TABLET | Freq: Once | ORAL | Status: AC
  Administered 2017-06-27: 5 mg via ORAL

## 2017-06-27 NOTE — Discharge Instructions (Signed)

## 2017-07-02 LAB — PANCREATIC ELASTASE, FECAL

## 2017-07-04 DIAGNOSIS — M25561 Pain in right knee: Secondary | ICD-10-CM | POA: Diagnosis not present

## 2017-07-04 DIAGNOSIS — M48061 Spinal stenosis, lumbar region without neurogenic claudication: Secondary | ICD-10-CM | POA: Diagnosis not present

## 2017-07-24 DIAGNOSIS — M5136 Other intervertebral disc degeneration, lumbar region: Secondary | ICD-10-CM | POA: Diagnosis not present

## 2017-08-08 DIAGNOSIS — M1711 Unilateral primary osteoarthritis, right knee: Secondary | ICD-10-CM | POA: Diagnosis not present

## 2017-08-08 DIAGNOSIS — M1712 Unilateral primary osteoarthritis, left knee: Secondary | ICD-10-CM | POA: Diagnosis not present

## 2017-08-08 DIAGNOSIS — M5136 Other intervertebral disc degeneration, lumbar region: Secondary | ICD-10-CM | POA: Diagnosis not present

## 2017-08-12 ENCOUNTER — Ambulatory Visit (INDEPENDENT_AMBULATORY_CARE_PROVIDER_SITE_OTHER): Payer: Medicare Other | Admitting: Gastroenterology

## 2017-08-12 ENCOUNTER — Encounter: Payer: Self-pay | Admitting: Gastroenterology

## 2017-08-12 VITALS — BP 120/68 | HR 80 | Ht 64.0 in | Wt 185.0 lb

## 2017-08-12 DIAGNOSIS — K580 Irritable bowel syndrome with diarrhea: Secondary | ICD-10-CM | POA: Diagnosis not present

## 2017-08-12 NOTE — Patient Instructions (Addendum)
One tablespoon of citrucel fiber in a lagre glass of water once daily.  Continue hyoscyamine medicine, I will refill as needed.  If you are age 71 or older, your body mass index should be between 23-30. Your Body mass index is 31.76 kg/m. If this is out of the aforementioned range listed, please consider follow up with your Primary Care Provider.  If you are age 81 or younger, your body mass index should be between 19-25. Your Body mass index is 31.76 kg/m. If this is out of the aformentioned range listed, please consider follow up with your Primary Care Provider.   It was a pleasure to meet you today!  Dr. Loletha Carrow

## 2017-08-12 NOTE — Progress Notes (Signed)
     [facility] Progress Note  Chief Complaint: Altered bowel function  Subjective  History:  [name] follows up after his office visit in early June.  He started medicine once or twice a day.  He definitely had decrease in the lower abdominal and rectal cramps and pain.  He still has feelings of incomplete evacuation, where he "uses a lot of toilet paper" and may need to go back to the toilet soon after he feels like he was finished.  Stool is formed, and there is no rectal bleeding.  His appetite is good and he has purposely lost a few pounds.  He and his wife are considering a move to [place], [place] to be near their daughter.  ROS: Cardiovascular:  no chest pain Respiratory: no dyspnea  The patient's Past Medical, Family and Social History were reviewed and are on file in the EMR.  Objective:  Med list reviewed  Current Outpatient Medications:  .  aspirin 81 MG tablet, Take 81 mg by mouth daily., Disp: , Rfl:  .  atorvastatin (LIPITOR) 80 MG tablet, Take 1 tablet (80 mg total) by mouth daily., Disp: 90 tablet, Rfl: 3 .  hyoscyamine (LEVSIN SL) 0.125 MG SL tablet, Place 1 tablet (0.125 mg total) under the tongue every 6 (six) hours as needed., Disp: 30 tablet, Rfl: 2 .  indomethacin (INDOCIN) 50 MG capsule, TAKE ONE CAPSULE BY MOUTH THREE TIMES DAILY WITH MEALS., Disp: 270 capsule, Rfl: 3 .  lisinopril (PRINIVIL,ZESTRIL) 20 MG tablet, Take 1 tablet (20 mg total) by mouth daily., Disp: 90 tablet, Rfl: 3 .  lisinopril-hydrochlorothiazide (PRINZIDE,ZESTORETIC) 20-25 MG tablet, Take 1 tablet by mouth daily., Disp: 90 tablet, Rfl: 3 .  Omega-3 Fatty Acids ([employer]S FISH OIL) 1000 MG CAPS, Take 1 capsule by mouth daily. (Patient taking differently: Take 1 capsule by mouth daily. 1400 mg), Disp: 1 capsule, Rfl: 0 .  PRESCRIPTION MEDICATION, Glaucoma eye gtts, Disp: , Rfl:  .  sildenafil (REVATIO) 20 MG tablet, TAKE 1 TABLET (20 MG TOTAL) BY MOUTH 5 (FIVE) TIMES DAILY AS NEEDED.,  Disp: 50 tablet, Rfl: 10   Vital signs in last 24 hrs: Vitals:   08/12/17 1125  BP: 120/68  Pulse: 80    Physical Exam  He looks well  HEENT: sclera anicteric, oral mucosa moist without lesions  Neck: supple, no thyromegaly, JVD or lymphadenopathy  Cardiac: RRR without murmurs, S1S2 heard, no peripheral edema  Pulm: clear to auscultation bilaterally, normal RR and effort noted  Abdomen: soft, no tenderness, with active bowel sounds. No guarding or palpable hepatosplenomegaly.  Skin; warm and dry, no jaundice or rash  Recent Labs:  TTG 1 IgA 193 Fecal elastase > 500   @ASSESSMENTPLANBEGIN @ Assessment: Encounter Diagnosis  Name Primary?  . Irritable bowel syndrome with diarrhea Yes    I think he has mild functional symptoms that are better on hyoscyamine.  He noted that when he forgot to take it on a recent trip, symptoms were decidedly worse until he got home.  Plan: I recommend once daily Citrucel fiber supplement and continued use of hyoscyamine.  I would prefer to do that rather than go up on medicine dose if possible.  He will contact me with further problems or need for advice and I will otherwise see him as needed.   Total time 20 minutes, over half spent face-to-face with patient in counseling and coordination of care.   Nelida Meuse III

## 2017-08-25 ENCOUNTER — Other Ambulatory Visit: Payer: Self-pay | Admitting: Family Medicine

## 2017-08-25 ENCOUNTER — Other Ambulatory Visit: Payer: Self-pay | Admitting: Gastroenterology

## 2017-08-26 DIAGNOSIS — M1711 Unilateral primary osteoarthritis, right knee: Secondary | ICD-10-CM | POA: Diagnosis not present

## 2017-09-02 DIAGNOSIS — M1711 Unilateral primary osteoarthritis, right knee: Secondary | ICD-10-CM | POA: Diagnosis not present

## 2017-09-09 DIAGNOSIS — M1711 Unilateral primary osteoarthritis, right knee: Secondary | ICD-10-CM | POA: Diagnosis not present

## 2017-09-18 ENCOUNTER — Other Ambulatory Visit: Payer: Self-pay | Admitting: Gastroenterology

## 2017-10-21 DIAGNOSIS — M25561 Pain in right knee: Secondary | ICD-10-CM | POA: Diagnosis not present

## 2017-10-21 DIAGNOSIS — M1711 Unilateral primary osteoarthritis, right knee: Secondary | ICD-10-CM | POA: Diagnosis not present

## 2017-11-11 ENCOUNTER — Telehealth: Payer: Self-pay | Admitting: Cardiology

## 2017-11-11 DIAGNOSIS — M25461 Effusion, right knee: Secondary | ICD-10-CM | POA: Diagnosis not present

## 2017-11-11 DIAGNOSIS — M5136 Other intervertebral disc degeneration, lumbar region: Secondary | ICD-10-CM | POA: Diagnosis not present

## 2017-11-11 DIAGNOSIS — M25561 Pain in right knee: Secondary | ICD-10-CM | POA: Diagnosis not present

## 2017-11-11 NOTE — Telephone Encounter (Signed)
Please advise if patient is covered to be seen. Last seen 12/2016

## 2017-11-11 NOTE — Telephone Encounter (Signed)
Returned [name] call regarding his 11/13/17 visit.  Advised that it should be a covered service as it is a follow-up for a condition.  He understood and thanked me for the call.

## 2017-11-11 NOTE — Telephone Encounter (Signed)
New Message           Patient has an appt. On 11/13/17. Patient wants to keep this but not sure if his insurance is paying for it or not. I told the patient that was a question for his insurance company, patient states that this was done for him in the past. I told patient I would take a message to see if someone could help him. Pls call and advise.

## 2017-11-12 NOTE — Progress Notes (Deleted)
HPI: FU coronary artery disease. In September 2001, he underwent cardiac catheterization in the setting of a myocardial infarction. He was found to have moderate coronary disease in the left system. There was 100% right coronary artery. His ejection fraction was 50%. He had PTCA/stenting of the right coronary artery at that time.Nuclear study December 2016 showed ejection fraction 48%. Prior inferior infarct but no ischemia.Abdominal ultrasound December 2016 showed no aneurysm.Since last seen,  Current Outpatient Medications  Medication Sig Dispense Refill  . aspirin 81 MG tablet Take 81 mg by mouth daily.    Marland Kitchen atorvastatin (LIPITOR) 80 MG tablet Take 1 tablet (80 mg total) by mouth daily. 90 tablet 3  . hyoscyamine (ANASPAZ) 0.125 MG TBDP disintergrating tablet PLACE ONE TABLET UNDER THE TONGUE EVERY 6 HOURS AS NEEDED 30 each 0  . indomethacin (INDOCIN) 50 MG capsule TAKE ONE CAPSULE BY MOUTH THREE TIMES DAILY WITH MEALS. 270 capsule 3  . lisinopril (PRINIVIL,ZESTRIL) 20 MG tablet Take 1 tablet (20 mg total) by mouth daily. 90 tablet 3  . lisinopril-hydrochlorothiazide (PRINZIDE,ZESTORETIC) 20-25 MG tablet TAKE ONE TABLET BY MOUTH DAILY 90 tablet 2  . Omega-3 Fatty Acids ([employer] FISH OIL) 1000 MG CAPS Take 1 capsule by mouth daily. (Patient taking differently: Take 1 capsule by mouth daily. 1400 mg) 1 capsule 0  . PRESCRIPTION MEDICATION Glaucoma eye gtts    . sildenafil (REVATIO) 20 MG tablet TAKE 1 TABLET (20 MG TOTAL) BY MOUTH 5 (FIVE) TIMES DAILY AS NEEDED. 50 tablet 10   No current facility-administered medications for this visit.      Past Medical History:  Diagnosis Date  . CAD (coronary artery disease)    sees Dr. Kirk Ruths   . Depression   . ED (erectile dysfunction)   . Gout   . Hyperlipemia   . Hypertension   . Myocardial infarction [facility])    Dr Stanford Breed 2001  . Sleep apnea    Dr Gwenette Greet  . Vitamin B 12 deficiency     Past Surgical History:  Procedure  Laterality Date  . ANGIOPLASTY  2001   stent placement  . CERVICAL LAMINECTOMY    . COLONOSCOPY  09/05/2016   per Dr. Loletha Carrow, adenomatous polyp, repeat in 5 yrs  . KNEE SURGERY    . strabimus     repair  . TONSILLECTOMY    . VASECTOMY REVERSAL      Social History   Socioeconomic History  . Marital status: Married    Spouse name: Not on file  . Number of children: Not on file  . Years of education: Not on file  . Highest education level: Not on file  Occupational History  . Not on file  Social Needs  . Financial resource strain: Not on file  . Food insecurity:    Worry: Not on file    Inability: Not on file  . Transportation needs:    Medical: Not on file    Non-medical: Not on file  Tobacco Use  . Smoking status: Former Smoker    Packs/day: 1.00    Years: 49.00    Pack years: 49.00    Types: Cigarettes    Last attempt to quit: 01/15/2008    Years since quitting: 9.8  . Smokeless tobacco: Never Used  Substance and Sexual Activity  . Alcohol use: Yes    Alcohol/week: 3.0 - 4.0 standard drinks    Types: 3 - 4 Standard drinks or equivalent per week    Comment:  4 beers per night   . Drug use: No  . Sexual activity: Not on file  Lifestyle  . Physical activity:    Days per week: Not on file    Minutes per session: Not on file  . Stress: Not on file  Relationships  . Social connections:    Talks on phone: Not on file    Gets together: Not on file    Attends religious service: Not on file    Active member of club or organization: Not on file    Attends meetings of clubs or organizations: Not on file    Relationship status: Not on file  . Intimate partner violence:    Fear of current or ex partner: Not on file    Emotionally abused: Not on file    Physically abused: Not on file    Forced sexual activity: Not on file  Other Topics Concern  . Not on file  Social History Narrative   He recently retired from education (classroom, principal, and Systems analyst).   He lives with wife.  They have three grown children, daughter is in dermatology residency at [employer].   Highest level of education:  masters    Family History  Problem Relation Age of Onset  . Emphysema Mother   . Diabetes Mother   . COPD Mother   . Heart disease Father   . Peripheral vascular disease Father   . Hypertension Father   . Diabetes Brother   . Healthy Daughter   . Healthy Son   . Colon cancer Neg Hx   . Stomach cancer Neg Hx     ROS: no fevers or chills, productive cough, hemoptysis, dysphasia, odynophagia, melena, hematochezia, dysuria, hematuria, rash, seizure activity, orthopnea, PND, pedal edema, claudication. Remaining systems are negative.  Physical Exam: Well-developed well-nourished in no acute distress.  Skin is warm and dry.  HEENT is normal.  Neck is supple.  Chest is clear to auscultation with normal expansion.  Cardiovascular exam is regular rate and rhythm.  Abdominal exam nontender or distended. No masses palpated. Extremities show no edema. neuro grossly intact  ECG- personally reviewed  A/P  1 coronary artery disease-patient doing well with no chest pain.  Plan to continue medical therapy including aspirin, statin and beta-blocker.  2 hypertension-blood pressure is controlled.  Continue present medications.  3 hyperlipidemia-continue statin.  Patient's lipids and liver are monitored by primary care.  Kirk Ruths, MD

## 2017-11-13 ENCOUNTER — Ambulatory Visit: Payer: Medicare Other | Admitting: Cardiology

## 2017-11-14 ENCOUNTER — Encounter: Payer: Self-pay | Admitting: *Deleted

## 2017-11-17 ENCOUNTER — Ambulatory Visit (INDEPENDENT_AMBULATORY_CARE_PROVIDER_SITE_OTHER): Payer: Medicare Other | Admitting: Cardiology

## 2017-11-17 ENCOUNTER — Encounter: Payer: Self-pay | Admitting: Cardiology

## 2017-11-17 VITALS — BP 140/77 | HR 72 | Resp 16 | Ht 65.0 in | Wt 197.4 lb

## 2017-11-17 DIAGNOSIS — Z72820 Sleep deprivation: Secondary | ICD-10-CM

## 2017-11-17 DIAGNOSIS — G4733 Obstructive sleep apnea (adult) (pediatric): Secondary | ICD-10-CM

## 2017-11-17 DIAGNOSIS — R5383 Other fatigue: Secondary | ICD-10-CM

## 2017-11-17 DIAGNOSIS — J439 Emphysema, unspecified: Secondary | ICD-10-CM | POA: Insufficient documentation

## 2017-11-17 DIAGNOSIS — M5136 Other intervertebral disc degeneration, lumbar region: Secondary | ICD-10-CM

## 2017-11-17 DIAGNOSIS — Z9861 Coronary angioplasty status: Secondary | ICD-10-CM

## 2017-11-17 DIAGNOSIS — E785 Hyperlipidemia, unspecified: Secondary | ICD-10-CM

## 2017-11-17 DIAGNOSIS — J438 Other emphysema: Secondary | ICD-10-CM

## 2017-11-17 DIAGNOSIS — I10 Essential (primary) hypertension: Secondary | ICD-10-CM | POA: Diagnosis not present

## 2017-11-17 DIAGNOSIS — I2510 Atherosclerotic heart disease of native coronary artery without angina pectoris: Secondary | ICD-10-CM | POA: Diagnosis not present

## 2017-11-17 NOTE — Assessment & Plan Note (Signed)
Patient has had documented sleep apnea in the remote past.  He was unable to tolerate a full mask CPAP at that time.  He still has complaints of poor sleep with frequent awakenings and daytime fatigue.  I think he would benefit from a repeat sleep study.

## 2017-11-17 NOTE — Progress Notes (Signed)
11/17/2017 Mitchell [name]   [birth year]  [id number]  Primary Physician Mitchell Morale, MD Primary Cardiologist:Mitchell [name]  HPI: Mitchell [name] is a pleasant 72 year old male followed by Mitchell [name].  He has had a remote RCA stent in the setting of an inferior MI in September 2001.  He is done well since from a cardiac standpoint.  He had a low risk Myoview study in December 2016.  Other medical issues include hypertension, hyperlipidemia, emphysema noted on a recent CT scan, sleep apnea diagnosed by prior study, and arthritis with evidence of spinal stenosis and right knee DJD.  He is in the office today for routine follow-up.  He tells me he is going to be moving soon to the [facility] area.  His daughter is a Mitchell [name] up there.  From a cardiac standpoint he is done well since his last office visit.  He has not been able to exercise as much as he used to because of his back problems.  He denies any chest pain or nitroglycerin use.  His primary care provider did labs in February 2019.  His renal function was normal, his LDL 84.   Current Outpatient Medications  Medication Sig Dispense Refill  . aspirin 81 MG tablet Take 81 mg by mouth daily.    Marland Kitchen atorvastatin (LIPITOR) 80 MG tablet Take 1 tablet (80 mg total) by mouth daily. 90 tablet 3  . celecoxib (CELEBREX) 200 MG capsule     . hyoscyamine (ANASPAZ) 0.125 MG TBDP disintergrating tablet PLACE ONE TABLET UNDER THE TONGUE EVERY 6 HOURS AS NEEDED 30 each 0  . lisinopril (PRINIVIL,ZESTRIL) 20 MG tablet Take 1 tablet (20 mg total) by mouth daily. 90 tablet 3  . lisinopril-hydrochlorothiazide (PRINZIDE,ZESTORETIC) 20-25 MG tablet TAKE ONE TABLET BY MOUTH DAILY 90 tablet 2  . Omega-3 Fatty Acids ([employer] FISH OIL) 1000 MG CAPS Take 1 capsule by mouth daily. (Patient taking differently: Take 1 capsule by mouth daily. 1400 mg) 1 capsule 0  . PRESCRIPTION MEDICATION Glaucoma eye gtts    . sildenafil (REVATIO) 20 MG tablet TAKE 1 TABLET (20 MG  TOTAL) BY MOUTH 5 (FIVE) TIMES DAILY AS NEEDED. 50 tablet 10   No current facility-administered medications for this visit.     Allergies  Allergen Reactions  . Tamsulosin Other (See Comments)    Discomfort in groin  . Tramadol Itching    Itching all over    Past Medical History:  Diagnosis Date  . CAD (coronary artery disease)    sees Mitchell [name]   . Depression   . ED (erectile dysfunction)   . Gout   . Hyperlipemia   . Hypertension   . Myocardial infarction [facility])    Mitchell Stanford [name] 2001  . Sleep apnea    Mitchell [name]  . Vitamin B 12 deficiency     Social History   Socioeconomic History  . Marital status: Married    Spouse name: Not on file  . Number of children: Not on file  . Years of education: Not on file  . Highest education level: Not on file  Occupational History  . Not on file  Social Needs  . Financial resource strain: Not on file  . Food insecurity:    Worry: Not on file    Inability: Not on file  . Transportation needs:    Medical: Not on file    Non-medical: Not on file  Tobacco Use  . Smoking status: Former Smoker    Packs/day: 1.00  Years: 49.00    Pack years: 49.00    Types: Cigarettes    Last attempt to quit: 01/15/2008    Years since quitting: 9.8  . Smokeless tobacco: Never Used  Substance and Sexual Activity  . Alcohol use: Yes    Alcohol/week: 3.0 - 4.0 standard drinks    Types: 3 - 4 Standard drinks or equivalent per week    Comment: 4 beers per night   . Drug use: No  . Sexual activity: Not on file  Lifestyle  . Physical activity:    Days per week: Not on file    Minutes per session: Not on file  . Stress: Not on file  Relationships  . Social connections:    Talks on phone: Not on file    Gets together: Not on file    Attends religious service: Not on file    Active member of club or organization: Not on file    Attends meetings of clubs or organizations: Not on file    Relationship status: Not on file  . Intimate  partner violence:    Fear of current or ex partner: Not on file    Emotionally abused: Not on file    Physically abused: Not on file    Forced sexual activity: Not on file  Other Topics Concern  . Not on file  Social History Narrative   He recently retired from education (classroom, principal, and Contractor).   He lives with wife.  They have three grown children, daughter is in dermatology residency at [employer].   Highest level of education:  masters     Family History  Problem Relation Age of Onset  . Emphysema Mother   . Diabetes Mother   . COPD Mother   . Heart disease Father   . Peripheral vascular disease Father   . Hypertension Father   . Diabetes Brother   . Healthy Daughter   . Healthy Son   . Colon cancer Neg Hx   . Stomach cancer Neg Hx      Review of Systems: General: negative for chills, fever, night sweats or weight changes.  Cardiovascular: negative for chest pain, dyspnea on exertion, edema, orthopnea, palpitations, paroxysmal nocturnal dyspnea or shortness of breath Dermatological: negative for rash Respiratory: negative for cough or wheezing Urologic: negative for hematuria Abdominal: negative for nausea, vomiting, diarrhea, bright red blood per rectum, melena, or hematemesis Neurologic: negative for visual changes, syncope, or dizziness Radiculopathy Rt leg All other systems reviewed and are otherwise negative except as noted above.    Blood pressure 140/77, pulse 72, resp. rate 16, height 5\' 5"  (1.651 m), weight 197 lb 6.4 oz (89.5 kg), SpO2 99 %.  General appearance: alert, cooperative, no distress and mildly obese Neck: no carotid bruit and no JVD Lungs: clear to auscultation bilaterally Heart: regular rate and rhythm Extremities: no edema Skin: Skin color, texture, turgor normal. No rashes or lesions Neurologic: Grossly normal  EKG NSR, inferior Qs-rate 67  ASSESSMENT AND PLAN:   CAD S/P percutaneous coronary angioplasty RCA PCI  in setting of DMI Sept '01 Low risk Myoview Dec 2016  Essential hypertension Repeat BP by me was 122/52  Dyslipidemia Recent LDL was 84 on high-dose Lipitor.  OBSTRUCTIVE SLEEP APNEA Patient has had documented sleep apnea in the remote past.  He was unable to tolerate a full mask CPAP at that time.  He still has complaints of poor sleep with frequent awakenings and daytime fatigue.  I think he  would benefit from a repeat sleep study.  Degeneration of lumbar intervertebral disc Mr. Fordham has DJD and evidence of spinal stenosis.  He also has arthritis in his right knee.  At this time surgery is not being contemplated.  Emphysema of lung ([place]) Noted on CT March 2019   PLAN  I suggested we get another sleep study as treatment modalities have changed. He related to me that he felt like he slept poorly with frequent awakenings. Drom a cardiac standpoint no further changes made. Will forward records as needed.   Kerin Ransom PA-C 11/17/2017 9:18 AM

## 2017-11-17 NOTE — Assessment & Plan Note (Signed)
Repeat BP by me was 122/52

## 2017-11-17 NOTE — Assessment & Plan Note (Signed)
Noted on CT March 2019

## 2017-11-17 NOTE — Assessment & Plan Note (Signed)
Mitchell [name] has DJD and evidence of spinal stenosis.  He also has arthritis in his right knee.  At this time surgery is not being contemplated.

## 2017-11-17 NOTE — Assessment & Plan Note (Signed)
Recent LDL was 84 on high-dose Lipitor.

## 2017-11-17 NOTE — Patient Instructions (Signed)
Medication Instructions:  Your physician recommends that you continue on your current medications as directed. Please refer to the Current Medication list given to you today.  If you need a refill on your cardiac medications before your next appointment, please call your pharmacy.   Lab work: none If you have labs (blood work) drawn today and your tests are completely normal, you will receive your results only by: Marland Kitchen MyChart Message (if you have MyChart) OR . A paper copy in the mail If you have any lab test that is abnormal or we need to change your treatment, we will call you to review the results.  Testing/Procedures: Your physician has recommended that you have a sleep study. This test records several body functions during sleep, including: brain activity, eye movement, oxygen and carbon dioxide blood levels, heart rate and rhythm, breathing rate and rhythm, the flow of air through your mouth and nose, snoring, body muscle movements, and chest and belly movement.  Your information has been sent to our Sleep Coordinator, Barry Brunner, she will be in contact with you once the Sleep Study has been approved by your insurance company. This process can take as long 2 weeks.    Follow-Up: At [facility], you and your health needs are our priority.  As part of our continuing mission to provide you with exceptional heart care, we have created designated Provider Care Teams.  These Care Teams include your primary Cardiologist (physician) and Advanced Practice Providers (APPs -  Physician Assistants and Nurse Practitioners) who all work together to provide you with the care you need, when you need it.  . Follow up with Dr. Stanford Breed or Kerin Ransom, PA as needed.   Any Other Special Instructions Will Be Listed Below (If Applicable).

## 2017-11-17 NOTE — Assessment & Plan Note (Signed)
RCA PCI in setting of DMI Sept '01 Low risk Myoview Dec 2016

## 2017-11-18 ENCOUNTER — Telehealth: Payer: Self-pay | Admitting: *Deleted

## 2017-11-18 NOTE — Telephone Encounter (Signed)
Patient notified of sleep study appointment scheduled for 12/30/17. Patient requested to be placed on the cancellation list. He is moving to [place] on 12/28/17. Kia notified.

## 2017-11-25 ENCOUNTER — Other Ambulatory Visit: Payer: Self-pay

## 2017-11-25 DIAGNOSIS — I2510 Atherosclerotic heart disease of native coronary artery without angina pectoris: Secondary | ICD-10-CM

## 2017-11-25 MED ORDER — ATORVASTATIN CALCIUM 80 MG PO TABS: 80.0000 mg | ORAL_TABLET | Freq: Every day | ORAL | 3 refills | Status: AC

## 2017-11-28 ENCOUNTER — Other Ambulatory Visit: Payer: Self-pay | Admitting: Gastroenterology

## 2017-12-04 DIAGNOSIS — M5431 Sciatica, right side: Secondary | ICD-10-CM | POA: Diagnosis not present

## 2017-12-04 DIAGNOSIS — M25561 Pain in right knee: Secondary | ICD-10-CM | POA: Diagnosis not present

## 2017-12-17 DIAGNOSIS — M5136 Other intervertebral disc degeneration, lumbar region: Secondary | ICD-10-CM | POA: Diagnosis not present

## 2017-12-23 DIAGNOSIS — M5136 Other intervertebral disc degeneration, lumbar region: Secondary | ICD-10-CM | POA: Diagnosis not present

## 2017-12-24 DIAGNOSIS — H401122 Primary open-angle glaucoma, left eye, moderate stage: Secondary | ICD-10-CM | POA: Diagnosis not present

## 2017-12-24 DIAGNOSIS — H401111 Primary open-angle glaucoma, right eye, mild stage: Secondary | ICD-10-CM | POA: Diagnosis not present

## 2017-12-30 ENCOUNTER — Encounter

## 2017-12-30 ENCOUNTER — Encounter (HOSPITAL_BASED_OUTPATIENT_CLINIC_OR_DEPARTMENT_OTHER): Payer: Medicare Other

## 2018-01-02 ENCOUNTER — Ambulatory Visit: Payer: Medicare Other | Admitting: Cardiology

## 2018-02-10 ENCOUNTER — Other Ambulatory Visit: Payer: Self-pay | Admitting: Family Medicine

## 2018-03-10 ENCOUNTER — Ambulatory Visit: Payer: Medicare Other

## 2018-05-29 ENCOUNTER — Other Ambulatory Visit: Payer: Self-pay | Admitting: Family Medicine

## 2018-06-10 NOTE — Telephone Encounter (Signed)
Please advise. Is the patient taking both of these medications?

## 2019-11-18 IMAGING — CT CT CHEST W/O CM
2 of 3 series · 15 of 36 positions shown, 18 images · non-contrast
Comparison: Chest CT 03/15/2016 and 02/27/2015.

CLINICAL DATA: 71-year-old male with a small right upper lobe lung
nodule first identified in February 2015. Former smoker. Subsequent
encounter.

EXAM:
CT CHEST WITHOUT CONTRAST
TECHNIQUE: Multidetector CT imaging of the chest was performed following the
standard protocol without IV contrast.

[Series 2: thorax · axial · 0.81mm/px · z∈[-299,-57]mm · 12 of 143 slices shown, 15 images]
[im 11/143  mediastinal]
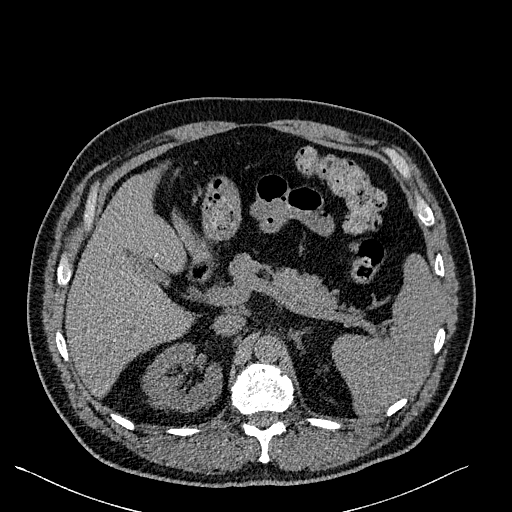
[im 11/143  lung]
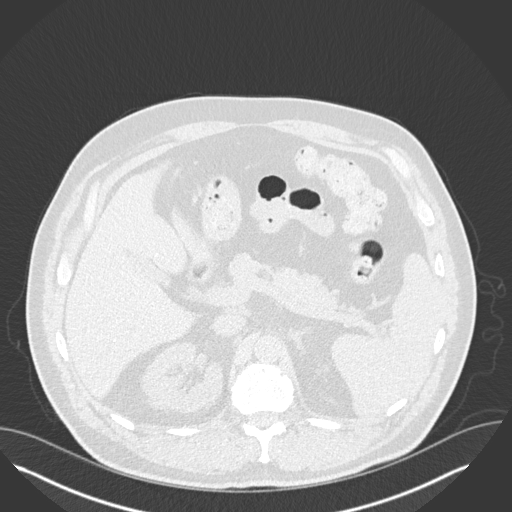
[im 22/143  lung]
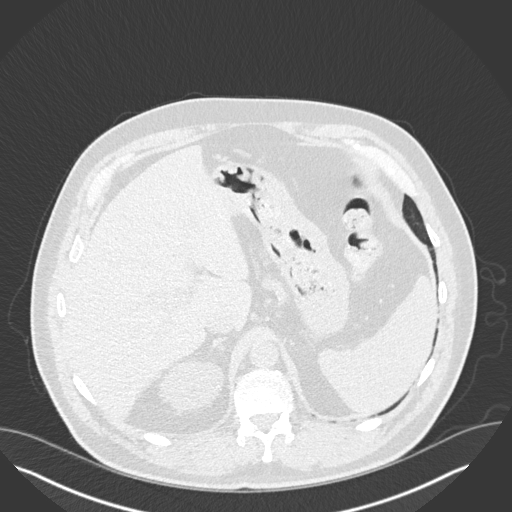
[im 32/143  lung]
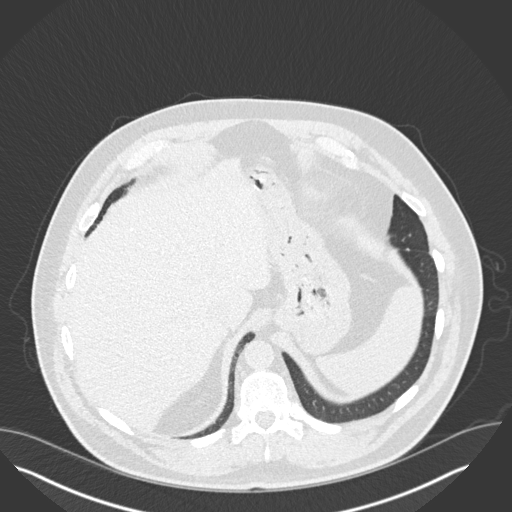
[im 43/143  lung]
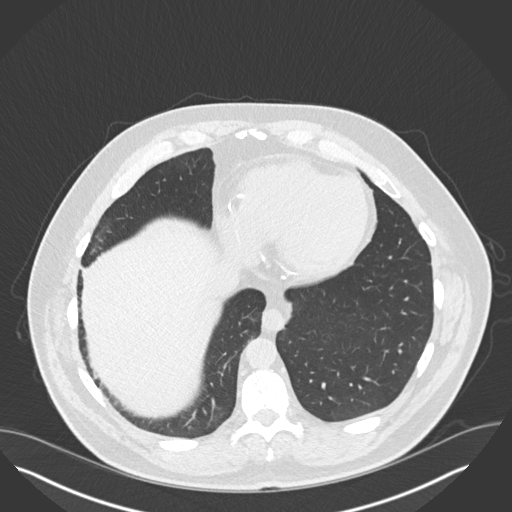
[im 53/143  mediastinal]
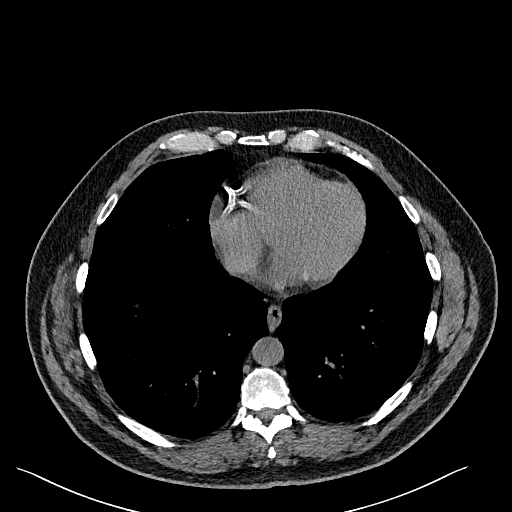
[im 53/143  lung]
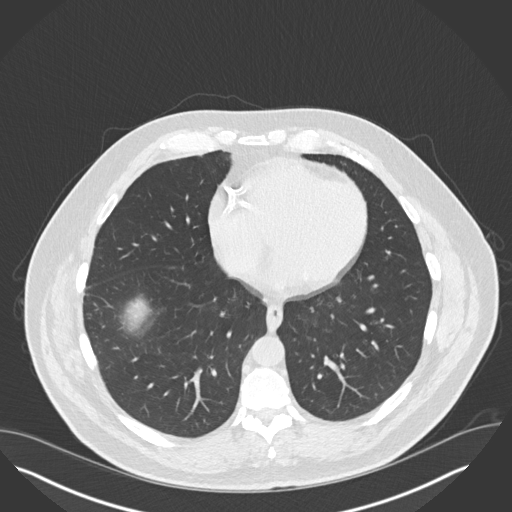
[im 64/143  lung]
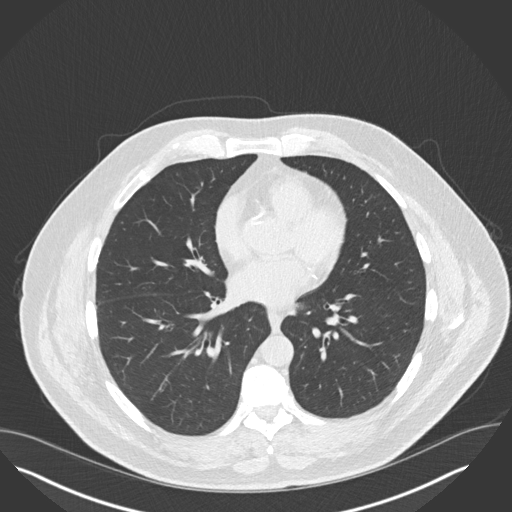
[im 79/143  lung]
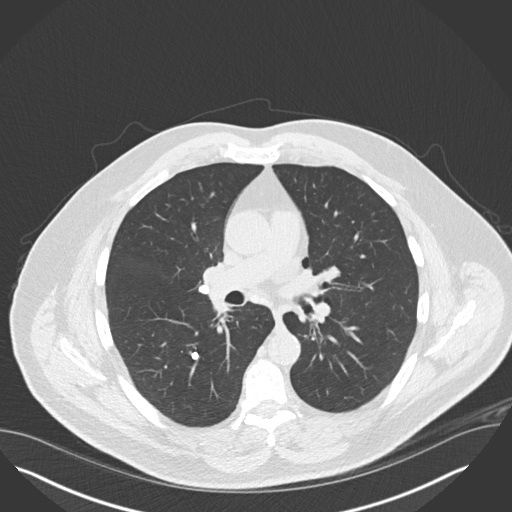
[im 90/143  lung]
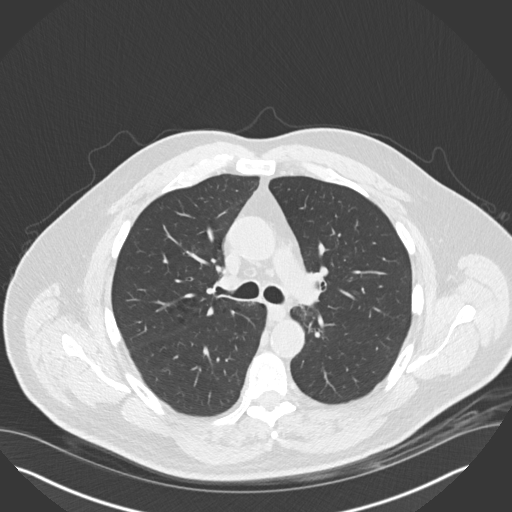
[im 100/143  mediastinal]
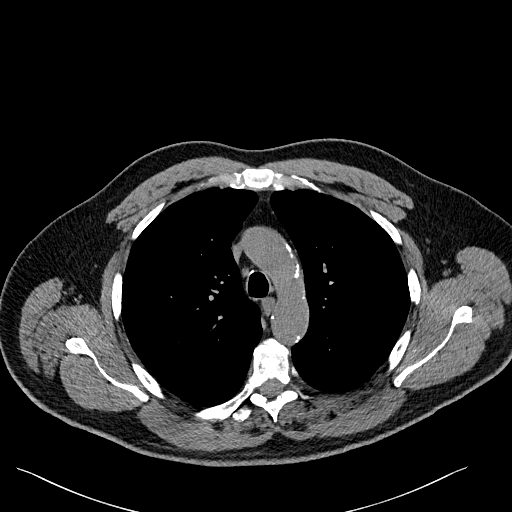
[im 100/143  lung]
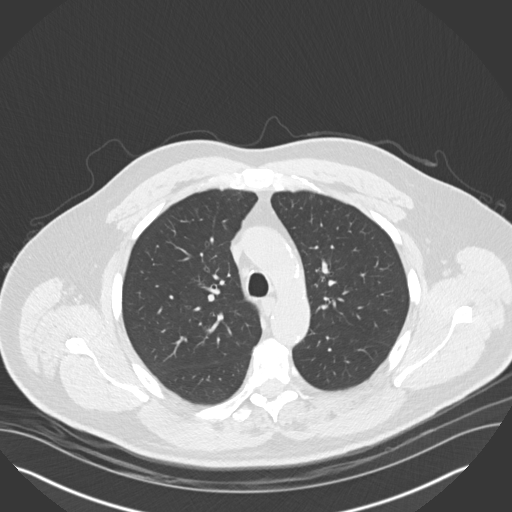
[im 111/143  lung]
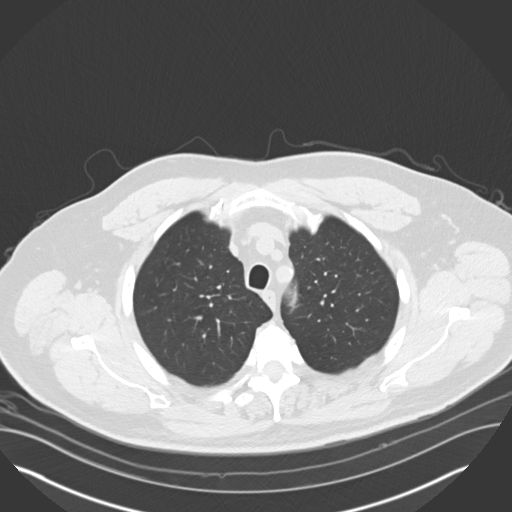
[im 121/143  lung]
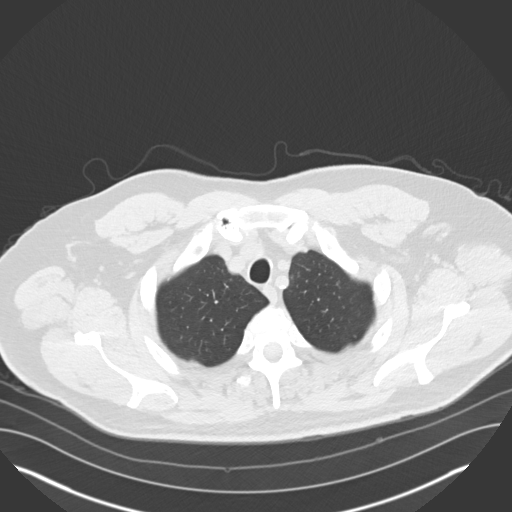
[im 132/143  lung]
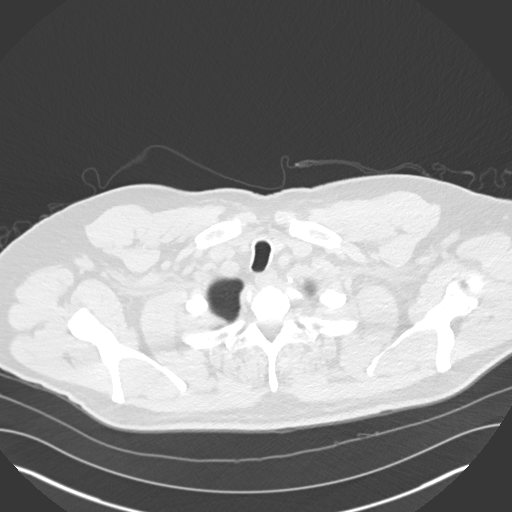

[Series 5: coronal · coronal · 0.59mm/px · 3 of 143 slices shown]
[im 29/143  lung]
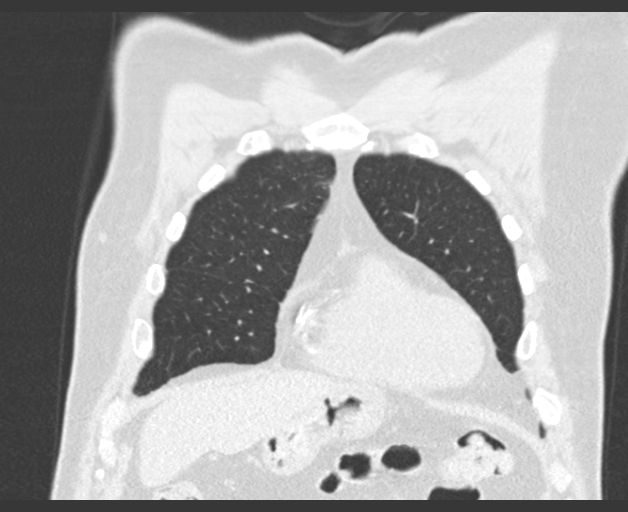
[im 57/143  lung]
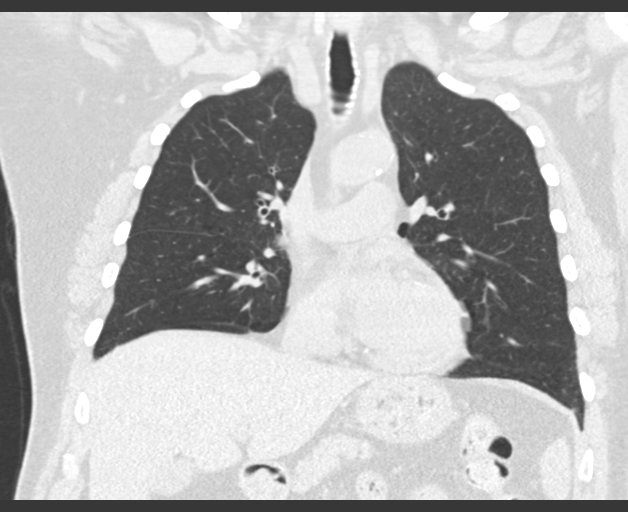
[im 86/143  lung]
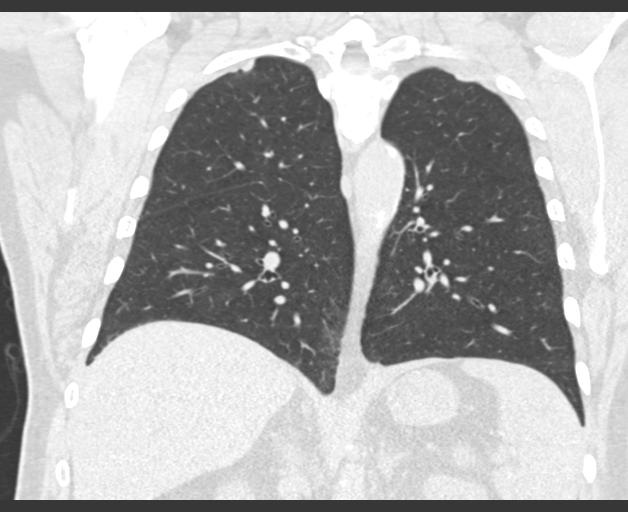

[15 of 36 positions shown; findings below may reference images not displayed]

FINDINGS: Cardiovascular: Calcified aortic atherosclerosis. Calcified coronary
artery atherosclerosis and/or stents. No cardiomegaly or pericardial
effusion. Vascular patency is not evaluated in the absence of IV
contrast.

Mediastinum/Nodes: No mediastinal lymphadenopathy. Small calcified
right hilar and subcarinal lymph nodes remain stable.

Lungs/Pleura: Incidental 5 millimeter calcified granuloma in the
central aspect of the right lower lobe.

Centrilobular emphysema, most apparent on series 3, image 52.

The tiny right upper lobe subpleural nodule is unchanged on series
3, image 24 since 5542 and benign. Mild chronic increased subpleural
opacity in the right lateral costophrenic angle is stable and most
resembles scarring. In the contralateral subpleural left upper lobe
on series 3, image 28 there is a tiny nodule which is unchanged
since 5542 and benign.

No new pulmonary opacity. No pleural effusion. The major airways are
stable and within normal limits.

Upper Abdomen: Tiny calcified granulomas in the liver and
occasionally in the spleen. Negative visible noncontrast upper
abdominal viscera.

Musculoskeletal: No acute osseous abnormality identified.
IMPRESSION: 1. Tiny upper lobe lung nodules are stable since 5542 and benign. No
further dedicated imaging follow-up of these is necessary, but in
light of smoking history consider whether this patient might benefit
from Low-dose Screening Chest CT, reference:
[URL]
2. Aortic Atherosclerosis (4H6NT-C7F.F) and Emphysema (4H6NT-KCG.P).
3. Granulomatous disease.

## 2021-10-02 ENCOUNTER — Encounter: Payer: Self-pay | Admitting: Gastroenterology
